# Patient Record
Sex: Male | Born: 1955 | Race: White | Hispanic: No | Marital: Married | Smoking: Current every day smoker
Health system: Southern US, Community
[De-identification: ages and names within clinical notes are randomized; demographics above are authoritative.]

## PROBLEM LIST (undated history)

## (undated) DIAGNOSIS — I2510 Atherosclerotic heart disease of native coronary artery without angina pectoris: Secondary | ICD-10-CM

## (undated) DIAGNOSIS — I719 Aortic aneurysm of unspecified site, without rupture: Secondary | ICD-10-CM

## (undated) DIAGNOSIS — I10 Essential (primary) hypertension: Secondary | ICD-10-CM

## (undated) DIAGNOSIS — Z87442 Personal history of urinary calculi: Secondary | ICD-10-CM

## (undated) DIAGNOSIS — C4491 Basal cell carcinoma of skin, unspecified: Secondary | ICD-10-CM

## (undated) DIAGNOSIS — J449 Chronic obstructive pulmonary disease, unspecified: Secondary | ICD-10-CM

## (undated) DIAGNOSIS — E785 Hyperlipidemia, unspecified: Secondary | ICD-10-CM

## (undated) DIAGNOSIS — Z872 Personal history of diseases of the skin and subcutaneous tissue: Secondary | ICD-10-CM

## (undated) HISTORY — DX: Hyperlipidemia, unspecified: E78.5

## (undated) HISTORY — DX: Essential (primary) hypertension: I10

## (undated) HISTORY — DX: Basal cell carcinoma of skin, unspecified: C44.91

## (undated) HISTORY — PX: SKIN CANCER EXCISION: SHX779

## (undated) HISTORY — DX: Personal history of diseases of the skin and subcutaneous tissue: Z87.2

## (undated) HISTORY — DX: Chronic obstructive pulmonary disease, unspecified: J44.9

---

## 2003-12-01 DIAGNOSIS — I252 Old myocardial infarction: Secondary | ICD-10-CM

## 2003-12-01 HISTORY — DX: Old myocardial infarction: I25.2

## 2003-12-23 ENCOUNTER — Other Ambulatory Visit: Payer: Self-pay

## 2004-11-22 ENCOUNTER — Ambulatory Visit: Payer: Self-pay | Admitting: Family Medicine

## 2006-05-14 ENCOUNTER — Ambulatory Visit: Payer: Self-pay | Admitting: Unknown Physician Specialty

## 2012-11-22 DIAGNOSIS — F17200 Nicotine dependence, unspecified, uncomplicated: Secondary | ICD-10-CM | POA: Insufficient documentation

## 2012-11-22 DIAGNOSIS — I2510 Atherosclerotic heart disease of native coronary artery without angina pectoris: Secondary | ICD-10-CM | POA: Insufficient documentation

## 2012-11-22 DIAGNOSIS — E7800 Pure hypercholesterolemia, unspecified: Secondary | ICD-10-CM | POA: Insufficient documentation

## 2013-07-11 ENCOUNTER — Ambulatory Visit: Payer: Self-pay | Admitting: Unknown Physician Specialty

## 2014-04-24 ENCOUNTER — Ambulatory Visit: Payer: Self-pay | Admitting: Family Medicine

## 2014-11-07 ENCOUNTER — Other Ambulatory Visit: Payer: Self-pay

## 2014-11-07 DIAGNOSIS — Z7189 Other specified counseling: Secondary | ICD-10-CM

## 2014-11-07 MED ORDER — NICOTINE 7 MG/24HR TD PT24: 7.0000 mg | MEDICATED_PATCH | Freq: Every day | TRANSDERMAL | Status: DC

## 2015-10-10 DIAGNOSIS — M9903 Segmental and somatic dysfunction of lumbar region: Secondary | ICD-10-CM | POA: Diagnosis not present

## 2015-10-10 DIAGNOSIS — M9901 Segmental and somatic dysfunction of cervical region: Secondary | ICD-10-CM | POA: Diagnosis not present

## 2015-10-10 DIAGNOSIS — M9902 Segmental and somatic dysfunction of thoracic region: Secondary | ICD-10-CM | POA: Diagnosis not present

## 2015-10-10 DIAGNOSIS — M545 Low back pain: Secondary | ICD-10-CM | POA: Diagnosis not present

## 2015-11-30 DIAGNOSIS — M9901 Segmental and somatic dysfunction of cervical region: Secondary | ICD-10-CM | POA: Diagnosis not present

## 2015-11-30 DIAGNOSIS — M9902 Segmental and somatic dysfunction of thoracic region: Secondary | ICD-10-CM | POA: Diagnosis not present

## 2015-11-30 DIAGNOSIS — M545 Low back pain: Secondary | ICD-10-CM | POA: Diagnosis not present

## 2015-11-30 DIAGNOSIS — M9903 Segmental and somatic dysfunction of lumbar region: Secondary | ICD-10-CM | POA: Diagnosis not present

## 2016-02-01 DIAGNOSIS — M9903 Segmental and somatic dysfunction of lumbar region: Secondary | ICD-10-CM | POA: Diagnosis not present

## 2016-02-01 DIAGNOSIS — M9902 Segmental and somatic dysfunction of thoracic region: Secondary | ICD-10-CM | POA: Diagnosis not present

## 2016-02-01 DIAGNOSIS — M545 Low back pain: Secondary | ICD-10-CM | POA: Diagnosis not present

## 2016-02-01 DIAGNOSIS — M9901 Segmental and somatic dysfunction of cervical region: Secondary | ICD-10-CM | POA: Diagnosis not present

## 2016-02-12 DIAGNOSIS — Z789 Other specified health status: Secondary | ICD-10-CM | POA: Diagnosis not present

## 2016-02-12 DIAGNOSIS — I2510 Atherosclerotic heart disease of native coronary artery without angina pectoris: Secondary | ICD-10-CM | POA: Diagnosis not present

## 2016-02-12 DIAGNOSIS — F17210 Nicotine dependence, cigarettes, uncomplicated: Secondary | ICD-10-CM | POA: Diagnosis not present

## 2016-02-21 ENCOUNTER — Encounter: Payer: Self-pay | Admitting: Family Medicine

## 2016-02-21 ENCOUNTER — Ambulatory Visit (INDEPENDENT_AMBULATORY_CARE_PROVIDER_SITE_OTHER): Payer: BLUE CROSS/BLUE SHIELD | Admitting: Family Medicine

## 2016-02-21 VITALS — BP 120/82 | HR 78 | Temp 97.8°F | Ht 70.0 in | Wt 205.0 lb

## 2016-02-21 DIAGNOSIS — J069 Acute upper respiratory infection, unspecified: Secondary | ICD-10-CM | POA: Diagnosis not present

## 2016-02-21 DIAGNOSIS — J40 Bronchitis, not specified as acute or chronic: Secondary | ICD-10-CM | POA: Diagnosis not present

## 2016-02-21 MED ORDER — GUAIFENESIN-CODEINE 100-10 MG/5ML PO SYRP: 5.0000 mL | ORAL_SOLUTION | Freq: Three times a day (TID) | ORAL | 0 refills | Status: DC | PRN

## 2016-02-21 MED ORDER — AZITHROMYCIN 250 MG PO TABS: ORAL_TABLET | ORAL | 0 refills | Status: DC

## 2016-02-21 NOTE — Progress Notes (Signed)
Name: Darren Page   MRN: [id number]    DOB: May 31, 1956   Date:02/21/2016       Progress Note  Subjective  Chief Complaint  Chief Complaint  Patient presents with  . Sinusitis    2 weeks/ chronic cough/ drainage- green prod    Sinusitis  This is a new problem. The current episode started in the past 7 days. The problem has been gradually worsening since onset. The pain is mild. Associated symptoms include congestion, coughing, ear pain and sinus pressure. Pertinent negatives include no chills, diaphoresis, headaches, hoarse voice, neck pain, shortness of breath, sneezing, sore throat or swollen glands. Past treatments include oral decongestants. The treatment provided no relief.  Cough  This is a new problem. The current episode started in the past 7 days. The problem has been gradually worsening. The cough is productive of purulent sputum. Associated symptoms include ear pain, nasal congestion and weight loss. Pertinent negatives include no chest pain, chills, fever, headaches, heartburn, hemoptysis, myalgias, postnasal drip, rash, sore throat, shortness of breath or wheezing. There is no history of environmental allergies.    No problem-specific Assessment & Plan notes found for this encounter.   Past Medical History:  Diagnosis Date  . Hyperlipidemia   . Hypertension     Past Surgical History:  Procedure Laterality Date  . SKIN CANCER EXCISION     mohs nose    History reviewed. No pertinent family history.  Social History   Social History  . Marital status: Married    Spouse name: N/A  . Number of children: N/A  . Years of education: N/A   Occupational History  . Not on file.   Social History Main Topics  . Smoking status: Not on file  . Smokeless tobacco: Not on file  . Alcohol use Not on file  . Drug use: Unknown  . Sexual activity: Not on file   Other Topics Concern  . Not on file   Social History Narrative  . No narrative on file     Allergies  Allergen Reactions  . Atorvastatin Rash    myalgias     Review of Systems  Constitutional: Positive for weight loss. Negative for chills, diaphoresis, fever and malaise/fatigue.  HENT: Positive for congestion, ear pain and sinus pressure. Negative for ear discharge, hoarse voice, postnasal drip, sneezing and sore throat.   Eyes: Negative for blurred vision.  Respiratory: Positive for cough. Negative for hemoptysis, sputum production, shortness of breath and wheezing.   Cardiovascular: Negative for chest pain, palpitations and leg swelling.  Gastrointestinal: Negative for abdominal pain, blood in stool, constipation, diarrhea, heartburn, melena and nausea.  Genitourinary: Negative for dysuria, frequency, hematuria and urgency.  Musculoskeletal: Negative for back pain, joint pain, myalgias and neck pain.  Skin: Negative for rash.  Neurological: Negative for dizziness, tingling, sensory change, focal weakness and headaches.  Endo/Heme/Allergies: Negative for environmental allergies and polydipsia. Does not bruise/bleed easily.  Psychiatric/Behavioral: Negative for depression and suicidal ideas. The patient is not nervous/anxious and does not have insomnia.      Objective  Vitals:   02/21/16 1638  BP: 120/82  Pulse: 78  Temp: 97.8 F (36.6 C)  Weight: 205 lb (93 kg)  Height: 5\' 10"  (1.778 m)    Physical Exam  Constitutional: He is oriented to person, place, and time and well-developed, well-nourished, and in no distress.  HENT:  Head: Normocephalic.  Right Ear: External ear normal.  Left Ear: External ear normal.  Nose: Nose normal.  Mouth/Throat: Oropharynx is clear and moist.  Eyes: Conjunctivae and EOM are normal. Pupils are equal, round, and reactive to light. Right eye exhibits no discharge. Left eye exhibits no discharge. No scleral icterus.  Neck: Normal range of motion. Neck supple. No JVD present. No tracheal deviation present. No thyromegaly present.   Cardiovascular: Normal rate, regular rhythm, normal heart sounds and intact distal pulses.  Exam reveals no gallop and no friction rub.   No murmur heard. Pulmonary/Chest: Breath sounds normal. No respiratory distress. He has no wheezes. He has no rales.  Abdominal: Soft. Bowel sounds are normal. He exhibits no mass. There is no hepatosplenomegaly. There is no tenderness. There is no rebound, no guarding and no CVA tenderness.  Musculoskeletal: Normal range of motion. He exhibits no edema or tenderness.  Lymphadenopathy:    He has no cervical adenopathy.  Neurological: He is alert and oriented to person, place, and time. He has normal sensation, normal strength, normal reflexes and intact cranial nerves. No cranial nerve deficit.  Skin: Skin is warm. No rash noted.  Psychiatric: Mood and affect normal.  Nursing note and vitals reviewed.     Assessment & Plan  Problem List Items Addressed This Visit    None    Visit Diagnoses    Bronchitis    -  Primary   Relevant Medications   guaiFENesin-codeine (ROBITUSSIN AC) 100-10 MG/5ML syrup   Upper respiratory infection       Relevant Medications   guaiFENesin-codeine (ROBITUSSIN AC) 100-10 MG/5ML syrup   azithromycin (ZITHROMAX) 250 MG tablet        Dr. Cortasia [facility]  02/21/16

## 2016-03-19 ENCOUNTER — Other Ambulatory Visit: Payer: Self-pay

## 2016-03-20 ENCOUNTER — Other Ambulatory Visit: Payer: Self-pay | Admitting: Family Medicine

## 2016-03-20 MED ORDER — LEVOFLOXACIN 500 MG PO TABS: 500.0000 mg | ORAL_TABLET | Freq: Every day | ORAL | 0 refills | Status: DC

## 2016-04-11 ENCOUNTER — Encounter: Payer: Self-pay | Admitting: Family Medicine

## 2016-04-11 ENCOUNTER — Ambulatory Visit
Admission: RE | Admit: 2016-04-11 | Discharge: 2016-04-11 | Disposition: A | Discharge status: Home / Self Care | Payer: BLUE CROSS/BLUE SHIELD | Source: Ambulatory Visit | Attending: Family Medicine | Admitting: Family Medicine

## 2016-04-11 ENCOUNTER — Ambulatory Visit (INDEPENDENT_AMBULATORY_CARE_PROVIDER_SITE_OTHER): Payer: BLUE CROSS/BLUE SHIELD | Admitting: Family Medicine

## 2016-04-11 VITALS — BP 120/78 | HR 68 | Ht 70.0 in | Wt 206.0 lb

## 2016-04-11 DIAGNOSIS — J410 Simple chronic bronchitis: Secondary | ICD-10-CM

## 2016-04-11 DIAGNOSIS — J40 Bronchitis, not specified as acute or chronic: Secondary | ICD-10-CM | POA: Diagnosis not present

## 2016-04-11 NOTE — Progress Notes (Signed)
Name: Darren Page   MRN: [id number]    DOB: [birth year]   Date:04/11/2016       Progress Note  Subjective  Chief Complaint  Chief Complaint  Patient presents with  . Follow-up    needs a chest xray due to chronic sinusitis/ cough    Cough  This is a recurrent problem. The current episode started 1 to 4 weeks ago. The problem has been gradually improving. Episode frequency: resolved. The cough is non-productive. Associated symptoms include weight loss. Pertinent negatives include no chest pain, chills, ear congestion, ear pain, fever, headaches, heartburn, hemoptysis, myalgias, nasal congestion, postnasal drip, rash, rhinorrhea, sore throat, shortness of breath, sweats or wheezing. Associated symptoms comments: "trying to lose a little". Nothing aggravates the symptoms. He has tried nothing (antibiotics) for the symptoms. The treatment provided mild relief. There is no history of asthma, bronchiectasis, bronchitis, COPD, emphysema, environmental allergies or pneumonia.    No problem-specific Assessment & Plan notes found for this encounter.   Past Medical History:  Diagnosis Date  . COPD (chronic obstructive pulmonary disease) ([place])   . Hyperlipidemia   . Hypertension     Past Surgical History:  Procedure Laterality Date  . SKIN CANCER EXCISION     mohs nose    History reviewed. No pertinent family history.  Social History   Social History  . Marital status: Married    Spouse name: N/A  . Number of children: N/A  . Years of education: N/A   Occupational History  . Not on file.   Social History Main Topics  . Smoking status: Former Research scientist (life sciences)  . Smokeless tobacco: Never Used  . Alcohol use Not on file  . Drug use: Unknown  . Sexual activity: Not Currently   Other Topics Concern  . Not on file   Social History Narrative  . No narrative on file    Allergies  Allergen Reactions  . Atorvastatin Rash    myalgias     Review of Systems  Constitutional:  Positive for weight loss. Negative for chills, fever and malaise/fatigue.  HENT: Negative for ear discharge, ear pain, postnasal drip, rhinorrhea and sore throat.   Eyes: Negative for blurred vision.  Respiratory: Positive for cough. Negative for hemoptysis, sputum production, shortness of breath and wheezing.   Cardiovascular: Negative for chest pain, palpitations and leg swelling.  Gastrointestinal: Negative for abdominal pain, blood in stool, constipation, diarrhea, heartburn, melena and nausea.  Genitourinary: Negative for dysuria, frequency, hematuria and urgency.  Musculoskeletal: Negative for back pain, joint pain, myalgias and neck pain.  Skin: Negative for rash.  Neurological: Negative for dizziness, tingling, sensory change, focal weakness and headaches.  Endo/Heme/Allergies: Negative for environmental allergies and polydipsia. Does not bruise/bleed easily.  Psychiatric/Behavioral: Negative for depression and suicidal ideas. The patient is not nervous/anxious and does not have insomnia.      Objective  Vitals:   04/11/16 0859  BP: 120/78  Pulse: 68  SpO2: 98%  Weight: 206 lb (93.4 kg)  Height: 5\' 10"  (1.778 m)    Physical Exam  Constitutional: He is oriented to person, place, and time and well-developed, well-nourished, and in no distress.  HENT:  Head: Normocephalic.  Right Ear: External ear normal.  Left Ear: External ear normal.  Nose: Nose normal.  Mouth/Throat: Oropharynx is clear and moist.  Eyes: Conjunctivae and EOM are normal. Pupils are equal, round, and reactive to light. Right eye exhibits no discharge. Left eye exhibits no discharge. No scleral icterus.  Neck: Normal  range of motion. Neck supple. No JVD present. No tracheal deviation present. No thyromegaly present.  Cardiovascular: Normal rate, regular rhythm, normal heart sounds and intact distal pulses.  Exam reveals no gallop and no friction rub.   No murmur heard. Pulmonary/Chest: Breath sounds  normal. No respiratory distress. He has no wheezes. He has no rales.  Abdominal: Soft. Bowel sounds are normal. He exhibits no mass. There is no hepatosplenomegaly. There is no tenderness. There is no rebound, no guarding and no CVA tenderness.  Musculoskeletal: Normal range of motion. He exhibits no edema or tenderness.  Lymphadenopathy:    He has no cervical adenopathy.  Neurological: He is alert and oriented to person, place, and time. He has normal sensation, normal strength, normal reflexes and intact cranial nerves. No cranial nerve deficit.  Skin: Skin is warm. No rash noted.  Psychiatric: Mood and affect normal.  Nursing note and vitals reviewed.     Assessment & Plan  Problem List Items Addressed This Visit    None    Visit Diagnoses    Simple chronic bronchitis ([place])    -  Primary   Relevant Orders   DG Chest 2 View (Completed)        Dr. Otilio Miu [facility]  04/11/16

## 2016-05-20 DIAGNOSIS — D485 Neoplasm of uncertain behavior of skin: Secondary | ICD-10-CM | POA: Diagnosis not present

## 2016-05-20 DIAGNOSIS — C4441 Basal cell carcinoma of skin of scalp and neck: Secondary | ICD-10-CM | POA: Diagnosis not present

## 2016-05-20 DIAGNOSIS — L821 Other seborrheic keratosis: Secondary | ICD-10-CM | POA: Diagnosis not present

## 2016-05-20 DIAGNOSIS — Z08 Encounter for follow-up examination after completed treatment for malignant neoplasm: Secondary | ICD-10-CM | POA: Diagnosis not present

## 2016-05-20 DIAGNOSIS — Z1283 Encounter for screening for malignant neoplasm of skin: Secondary | ICD-10-CM | POA: Diagnosis not present

## 2016-05-20 DIAGNOSIS — L570 Actinic keratosis: Secondary | ICD-10-CM | POA: Diagnosis not present

## 2016-05-20 DIAGNOSIS — Z85828 Personal history of other malignant neoplasm of skin: Secondary | ICD-10-CM | POA: Diagnosis not present

## 2016-06-02 DIAGNOSIS — C4491 Basal cell carcinoma of skin, unspecified: Secondary | ICD-10-CM

## 2016-06-02 HISTORY — DX: Basal cell carcinoma of skin, unspecified: C44.91

## 2016-06-30 DIAGNOSIS — C4441 Basal cell carcinoma of skin of scalp and neck: Secondary | ICD-10-CM | POA: Diagnosis not present

## 2016-06-30 DIAGNOSIS — L821 Other seborrheic keratosis: Secondary | ICD-10-CM | POA: Diagnosis not present

## 2016-07-15 DIAGNOSIS — I2510 Atherosclerotic heart disease of native coronary artery without angina pectoris: Secondary | ICD-10-CM | POA: Diagnosis not present

## 2016-07-15 DIAGNOSIS — F17210 Nicotine dependence, cigarettes, uncomplicated: Secondary | ICD-10-CM | POA: Diagnosis not present

## 2016-07-15 DIAGNOSIS — Z789 Other specified health status: Secondary | ICD-10-CM | POA: Diagnosis not present

## 2016-07-15 DIAGNOSIS — E785 Hyperlipidemia, unspecified: Secondary | ICD-10-CM | POA: Diagnosis not present

## 2016-09-11 DIAGNOSIS — H6980 Other specified disorders of Eustachian tube, unspecified ear: Secondary | ICD-10-CM | POA: Diagnosis not present

## 2016-09-11 DIAGNOSIS — H903 Sensorineural hearing loss, bilateral: Secondary | ICD-10-CM | POA: Diagnosis not present

## 2016-10-14 DIAGNOSIS — H903 Sensorineural hearing loss, bilateral: Secondary | ICD-10-CM | POA: Diagnosis not present

## 2017-01-15 ENCOUNTER — Encounter: Payer: Self-pay | Admitting: Family Medicine

## 2017-01-15 ENCOUNTER — Ambulatory Visit (INDEPENDENT_AMBULATORY_CARE_PROVIDER_SITE_OTHER): Payer: BLUE CROSS/BLUE SHIELD | Admitting: Family Medicine

## 2017-01-15 VITALS — BP 120/70 | HR 72 | Ht 70.0 in | Wt 204.0 lb

## 2017-01-15 DIAGNOSIS — J0100 Acute maxillary sinusitis, unspecified: Secondary | ICD-10-CM | POA: Diagnosis not present

## 2017-01-15 DIAGNOSIS — J40 Bronchitis, not specified as acute or chronic: Secondary | ICD-10-CM | POA: Diagnosis not present

## 2017-01-15 MED ORDER — AMOXICILLIN-POT CLAVULANATE 875-125 MG PO TABS: 1.0000 | ORAL_TABLET | Freq: Two times a day (BID) | ORAL | 0 refills | Status: DC

## 2017-01-15 MED ORDER — GUAIFENESIN-CODEINE 100-10 MG/5ML PO SYRP: 5.0000 mL | ORAL_SOLUTION | Freq: Three times a day (TID) | ORAL | 0 refills | Status: DC | PRN

## 2017-01-15 NOTE — Progress Notes (Signed)
Name: Darren Page   MRN: [id number]    DOB: Jul 29, 1955   Date:01/15/2017       Progress Note  Subjective  Chief Complaint  Chief Complaint  Patient presents with  . Sinusitis    started about 1 month ago- drainage and cough gets worse at night. "Fly alot"    Sinusitis  This is a new problem. The current episode started 1 to 4 weeks ago. The problem is unchanged. There has been no fever. The fever has been present for 3 to 4 days. The pain is moderate. Pertinent negatives include no chills, congestion, coughing, diaphoresis, ear pain, headaches, hoarse voice, neck pain, shortness of breath, sinus pressure, sneezing, sore throat or swollen glands. Past treatments include nothing. The treatment provided moderate relief.  Cough  This is a chronic problem. The problem has been waxing and waning. The cough is productive of purulent sputum. Associated symptoms include ear congestion, nasal congestion and rhinorrhea. Pertinent negatives include no chest pain, chills, ear pain, fever, headaches, heartburn, hemoptysis, myalgias, postnasal drip, rash, sore throat, shortness of breath, sweats, weight loss or wheezing. The symptoms are aggravated by pollens. The treatment provided mild relief. There is no history of asthma, bronchiectasis, bronchitis, COPD, emphysema, environmental allergies or pneumonia.    No problem-specific Assessment & Plan notes found for this encounter.   Past Medical History:  Diagnosis Date  . COPD (chronic obstructive pulmonary disease) ([place])   . Hyperlipidemia   . Hypertension     Past Surgical History:  Procedure Laterality Date  . SKIN CANCER EXCISION     mohs nose    No family history on file.  Social History   Social History  . Marital status: Married    Spouse name: N/A  . Number of children: N/A  . Years of education: N/A   Occupational History  . Not on file.   Social History Main Topics  . Smoking status: Former Research scientist (life sciences)  . Smokeless  tobacco: Never Used  . Alcohol use Not on file  . Drug use: Unknown  . Sexual activity: Not Currently   Other Topics Concern  . Not on file   Social History Narrative  . No narrative on file    Allergies  Allergen Reactions  . Atorvastatin Rash    myalgias    Outpatient Medications Prior to Visit  Medication Sig Dispense Refill  . acetaminophen (TYLENOL) 500 MG tablet Take 1,000 mg by mouth.    Marland Kitchen aspirin 81 MG chewable tablet Chew 81 mg by mouth.    . metoprolol succinate (TOPROL-XL) 25 MG 24 hr tablet Take 25 mg by mouth.    . nicotine (NICODERM CQ) 7 mg/24hr patch Place 1 patch (7 mg total) onto the skin daily. 28 patch 0  . Omega-3 1000 MG CAPS Take 1 capsule by mouth daily.    . nitroGLYCERIN (NITROSTAT) 0.4 MG SL tablet Place 0.4 mg under the tongue.     No facility-administered medications prior to visit.     Review of Systems  Constitutional: Negative for chills, diaphoresis, fever, malaise/fatigue and weight loss.  HENT: Positive for rhinorrhea. Negative for congestion, ear discharge, ear pain, hoarse voice, postnasal drip, sinus pressure, sneezing and sore throat.   Eyes: Negative for blurred vision.  Respiratory: Negative for cough, hemoptysis, sputum production, shortness of breath and wheezing.   Cardiovascular: Negative for chest pain, palpitations and leg swelling.  Gastrointestinal: Negative for abdominal pain, blood in stool, constipation, diarrhea, heartburn, melena and nausea.  Genitourinary:  Negative for dysuria, frequency, hematuria and urgency.  Musculoskeletal: Negative for back pain, joint pain, myalgias and neck pain.  Skin: Negative for rash.  Neurological: Negative for dizziness, tingling, sensory change, focal weakness and headaches.  Endo/Heme/Allergies: Negative for environmental allergies and polydipsia. Does not bruise/bleed easily.  Psychiatric/Behavioral: Negative for depression and suicidal ideas. The patient is not nervous/anxious and does  not have insomnia.      Objective  Vitals:   01/15/17 1547  BP: 120/70  Pulse: 72  Weight: 204 lb (92.5 kg)  Height: 5\' 10"  (1.778 m)    Physical Exam  Constitutional: He is oriented to person, place, and time and well-developed, well-nourished, and in no distress.  HENT:  Head: Normocephalic.  Right Ear: External ear normal.  Left Ear: External ear normal.  Nose: Nose normal.  Mouth/Throat: Oropharynx is clear and moist.  Eyes: Pupils are equal, round, and reactive to light. Conjunctivae and EOM are normal. Right eye exhibits no discharge. Left eye exhibits no discharge. No scleral icterus.  Neck: Normal range of motion. Neck supple. No JVD present. No tracheal deviation present. No thyromegaly present.  Cardiovascular: Normal rate, regular rhythm, normal heart sounds and intact distal pulses.  Exam reveals no gallop and no friction rub.   No murmur heard. Pulmonary/Chest: Breath sounds normal. No respiratory distress. He has no wheezes. He has no rales.  Abdominal: Soft. Bowel sounds are normal. He exhibits no mass. There is no hepatosplenomegaly. There is no tenderness. There is no rebound, no guarding and no CVA tenderness.  Musculoskeletal: Normal range of motion. He exhibits no edema or tenderness.  Lymphadenopathy:    He has no cervical adenopathy.  Neurological: He is alert and oriented to person, place, and time. He has normal sensation, normal strength and intact cranial nerves. No cranial nerve deficit.  Skin: Skin is warm. No rash noted.  Psychiatric: Mood and affect normal.  Nursing note and vitals reviewed.     Assessment & Plan  Problem List Items Addressed This Visit    None    Visit Diagnoses    Bronchitis    -  Primary   Relevant Medications   amoxicillin-clavulanate (AUGMENTIN) 875-125 MG tablet   guaiFENesin-codeine (ROBITUSSIN AC) 100-10 MG/5ML syrup   Acute maxillary sinusitis, recurrence not specified       Relevant Medications    amoxicillin-clavulanate (AUGMENTIN) 875-125 MG tablet   guaiFENesin-codeine (ROBITUSSIN AC) 100-10 MG/5ML syrup      Meds ordered this encounter  Medications  . amoxicillin-clavulanate (AUGMENTIN) 875-125 MG tablet    Sig: Take 1 tablet by mouth 2 (two) times daily.    Dispense:  20 tablet    Refill:  0  . guaiFENesin-codeine (ROBITUSSIN AC) 100-10 MG/5ML syrup    Sig: Take 5 mLs by mouth 3 (three) times daily as needed for cough.    Dispense:  150 mL    Refill:  0      Dr. [facility]  01/15/17

## 2017-01-16 ENCOUNTER — Ambulatory Visit: Payer: BLUE CROSS/BLUE SHIELD | Admitting: Family Medicine

## 2017-01-27 ENCOUNTER — Other Ambulatory Visit: Payer: Self-pay

## 2017-01-27 MED ORDER — LEVOFLOXACIN 500 MG PO TABS: 500.0000 mg | ORAL_TABLET | Freq: Every day | ORAL | 0 refills | Status: DC

## 2017-01-27 NOTE — Telephone Encounter (Signed)
Patient called and stated he finished his antibiotics and stated still not feeling well. Spoke with Dr. Ronnald Ramp and she verbally stated it was okay to send in Levaquin 500 mg to [facility].

## 2017-01-28 ENCOUNTER — Other Ambulatory Visit: Payer: Self-pay

## 2017-01-28 DIAGNOSIS — R059 Cough, unspecified: Secondary | ICD-10-CM

## 2017-01-28 DIAGNOSIS — R05 Cough: Secondary | ICD-10-CM

## 2017-01-28 MED ORDER — DOXYCYCLINE HYCLATE 100 MG PO TABS: 100.0000 mg | ORAL_TABLET | Freq: Two times a day (BID) | ORAL | 0 refills | Status: DC

## 2017-02-03 IMAGING — CR DG CHEST 2V
2 series · 3 of 3 positions shown · non-contrast
Comparison: 04/24/2014 .

CLINICAL DATA: Bronchitis.

EXAM:
CHEST  2 VIEW

[Series 1: chest pa · 0.14mm/px · 2 of 2 slices shown]
[im 1/2]
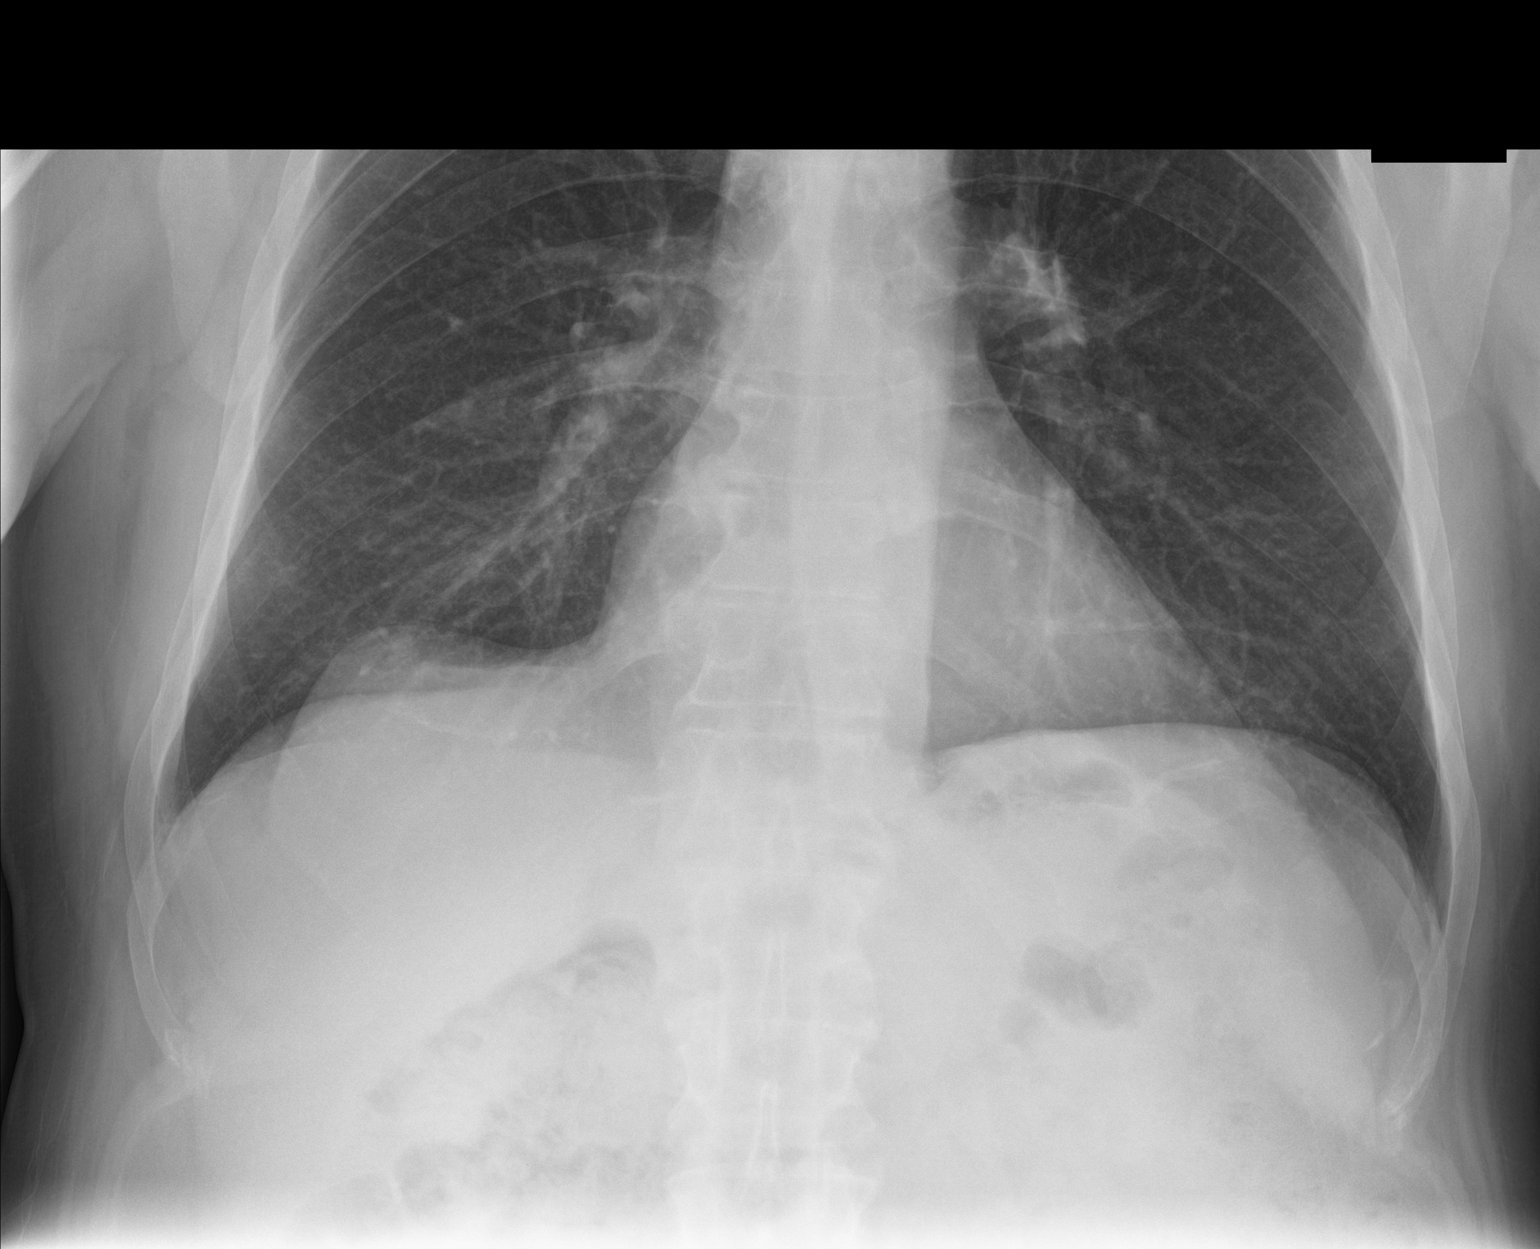
[im 2/2]
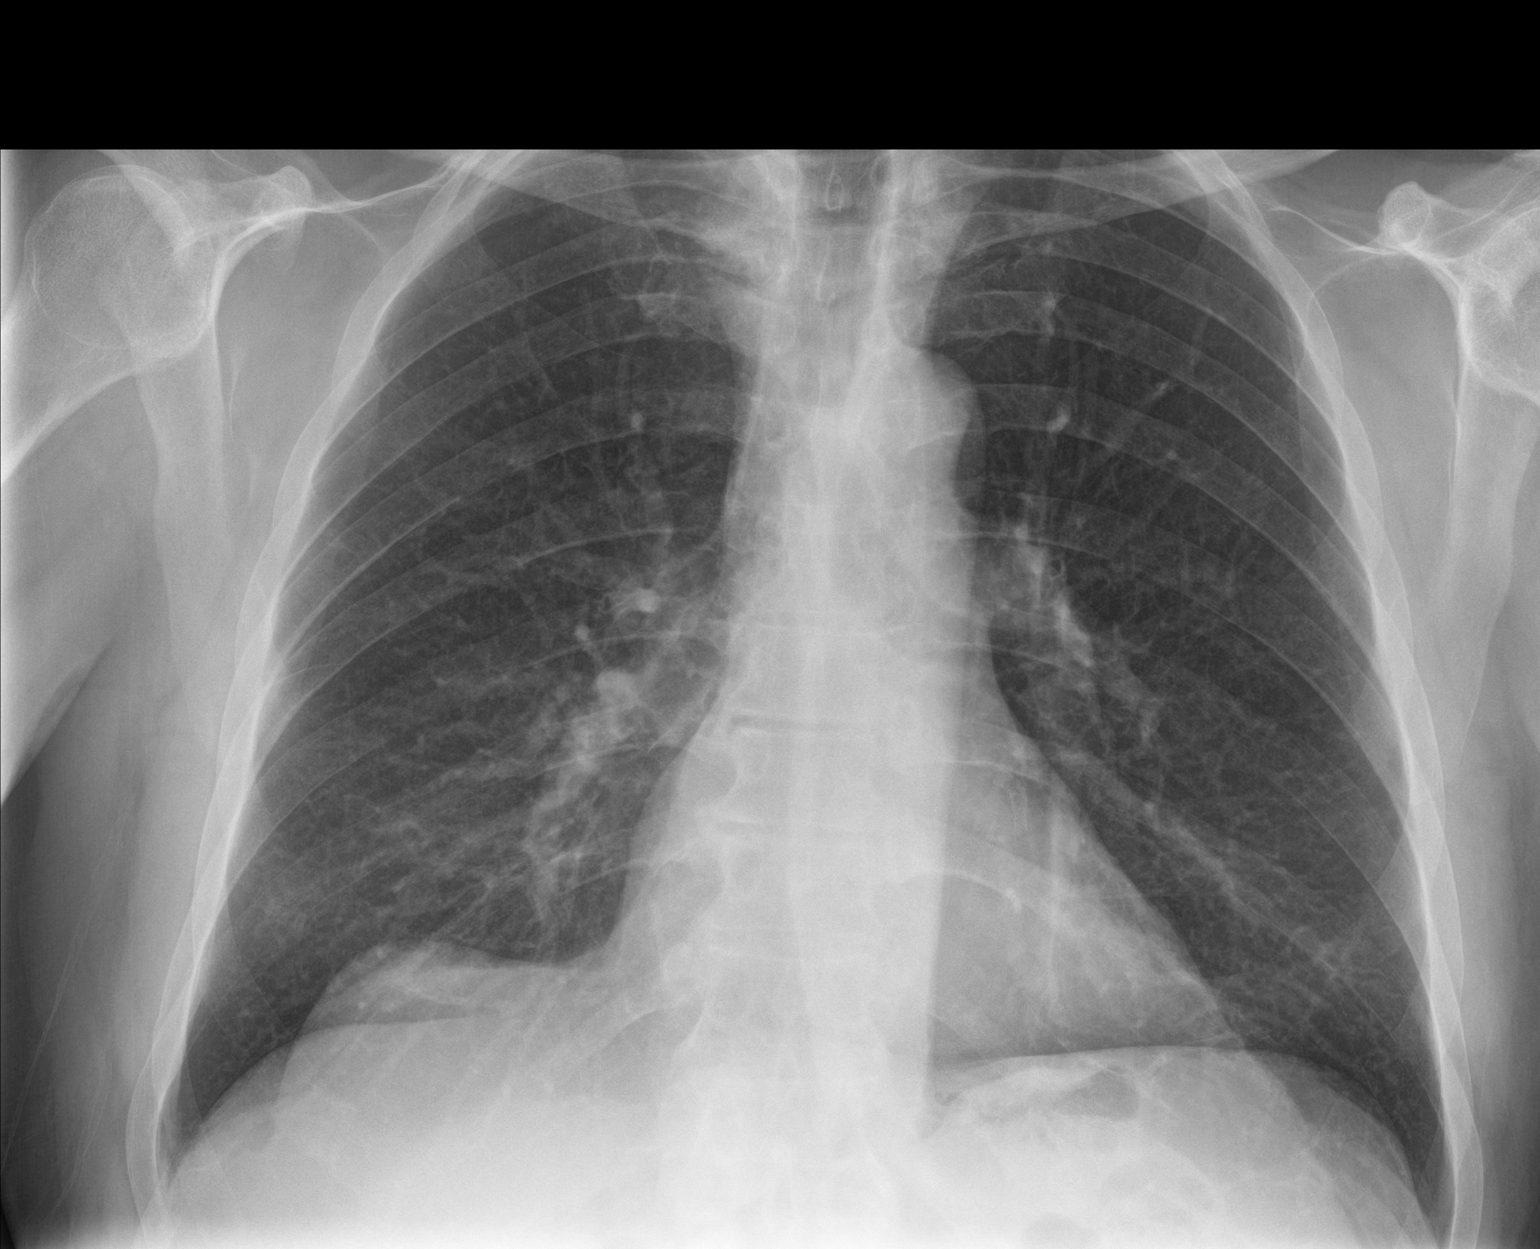

[chest lat]
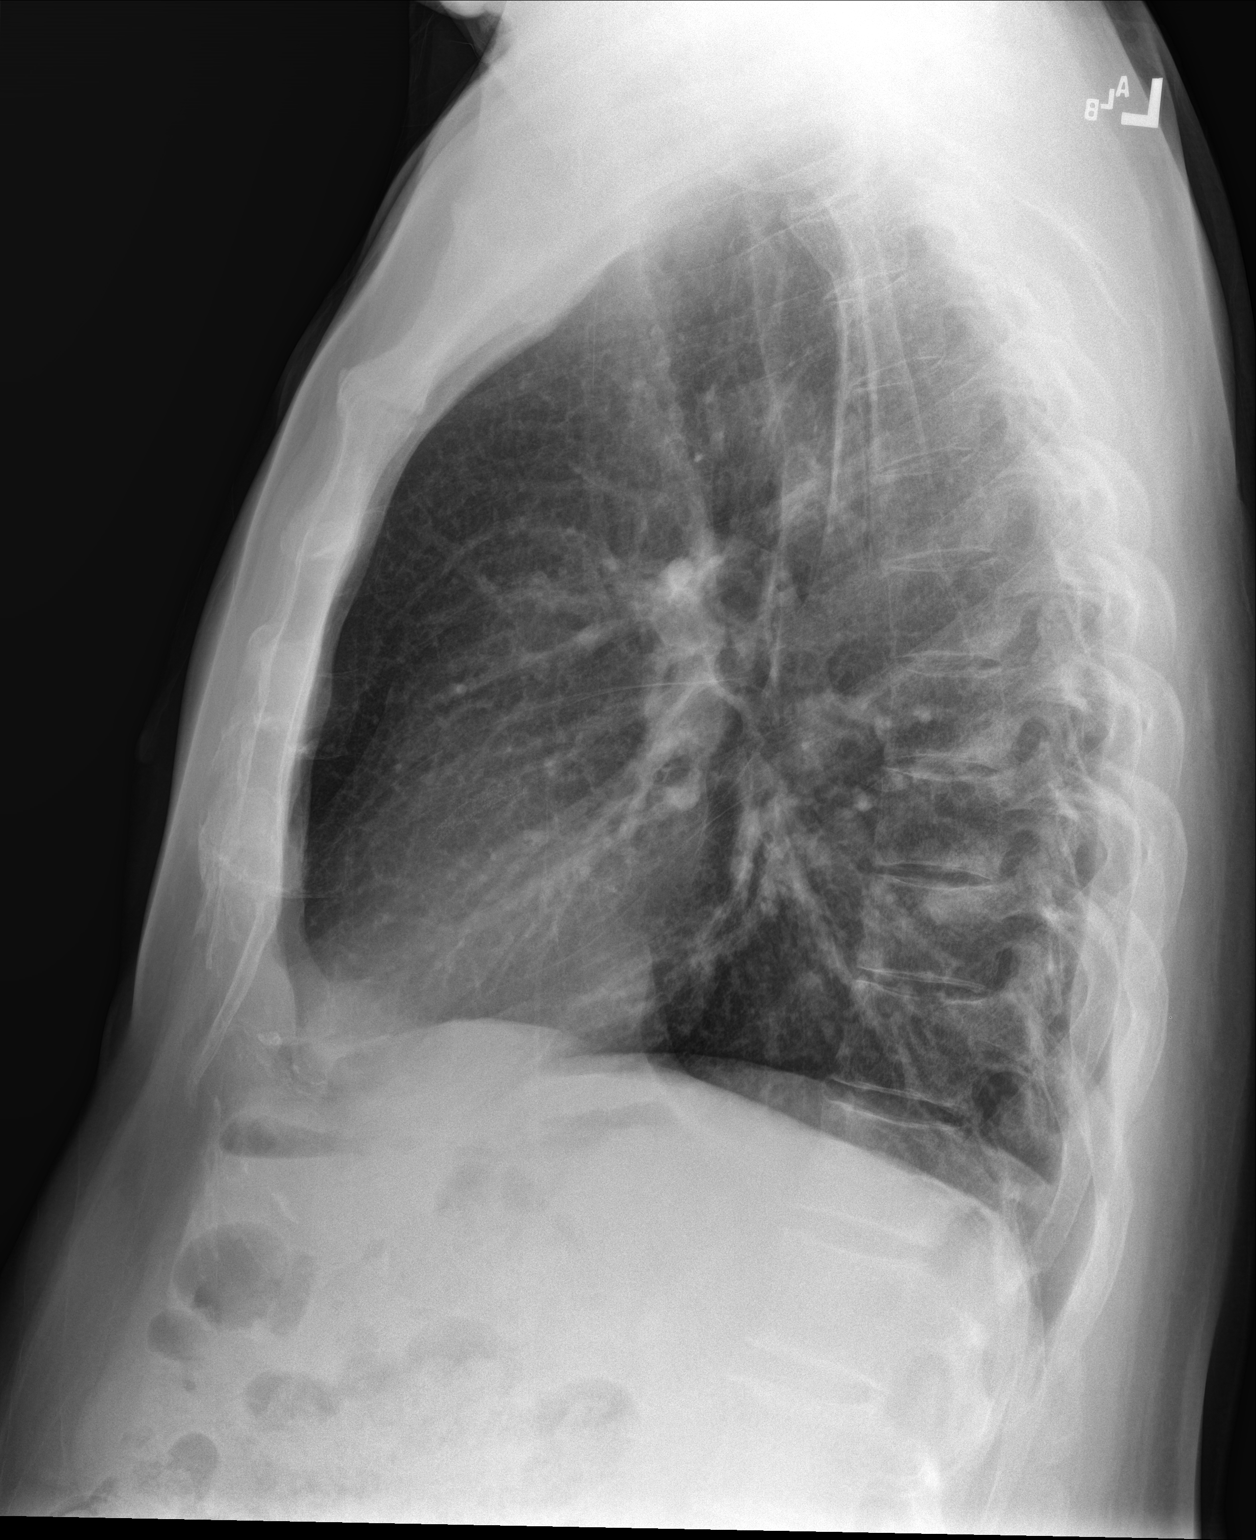

[3 of 3 positions shown; findings below may reference images not displayed]

FINDINGS: Mediastinum hilar structures normal. No focal infiltrate. No pleural
effusion or pneumothorax. Heart size stable. No acute bony
abnormality. Degenerative changes thoracic spine .
IMPRESSION: No acute cardiopulmonary disease.

## 2017-02-27 DIAGNOSIS — Z6830 Body mass index (BMI) 30.0-30.9, adult: Secondary | ICD-10-CM | POA: Diagnosis not present

## 2017-02-27 DIAGNOSIS — I2510 Atherosclerotic heart disease of native coronary artery without angina pectoris: Secondary | ICD-10-CM | POA: Diagnosis not present

## 2017-03-09 ENCOUNTER — Other Ambulatory Visit: Payer: Self-pay | Admitting: Family Medicine

## 2017-05-21 ENCOUNTER — Encounter: Payer: Self-pay | Admitting: Family Medicine

## 2017-05-21 ENCOUNTER — Ambulatory Visit: Payer: BLUE CROSS/BLUE SHIELD | Admitting: Family Medicine

## 2017-05-21 VITALS — BP 120/80 | HR 60 | Ht 70.0 in | Wt 208.0 lb

## 2017-05-21 DIAGNOSIS — F17210 Nicotine dependence, cigarettes, uncomplicated: Secondary | ICD-10-CM

## 2017-05-21 MED ORDER — NICOTINE 21 MG/24HR TD PT24: 21.0000 mg | MEDICATED_PATCH | Freq: Every day | TRANSDERMAL | 0 refills | Status: DC

## 2017-05-21 MED ORDER — NICOTINE POLACRILEX 4 MG MT GUM: 4.0000 mg | CHEWING_GUM | OROMUCOSAL | 0 refills | Status: DC | PRN

## 2017-05-21 NOTE — Progress Notes (Signed)
Name: Darren Page   MRN: [id number]    DOB: [birth year]   Date:05/21/2017       Progress Note  Subjective  Chief Complaint  Chief Complaint  Patient presents with  . Nicotine Dependence    wants a Rx for patches    Nicotine Dependence  Presents for initial visit. Symptoms are negative for cravings, decreased concentration, fatigue, headache, insomnia, irritability and sore throat. Preferred tobacco types include cigarettes. Preferred cigarette types include filtered. Preferred strength is regular. His urge triggers include company of smokers. Risk factors do not include contact with substance, decrease in perceived risk, disruptive behavior, drinking alcohol, drinking coffee, driving, meal time, perceived media message about smoking or stress.He smokes 1 pack of cigarettes per day. Past treatments include nicotine patch. The treatment provided significant relief. Compliance with prior treatments has been good.    No problem-specific Assessment & Plan notes found for this encounter.   Past Medical History:  Diagnosis Date  . COPD (chronic obstructive pulmonary disease) ([place])   . Hyperlipidemia   . Hypertension     Past Surgical History:  Procedure Laterality Date  . SKIN CANCER EXCISION     mohs nose    No family history on file.  Social History   Socioeconomic History  . Marital status: Married    Spouse name: Not on file  . Number of children: Not on file  . Years of education: Not on file  . Highest education level: Not on file  Social Needs  . Financial resource strain: Not on file  . Food insecurity - worry: Not on file  . Food insecurity - inability: Not on file  . Transportation needs - medical: Not on file  . Transportation needs - non-medical: Not on file  Occupational History  . Not on file  Tobacco Use  . Smoking status: Current Some Day Smoker    Types: Cigarettes  . Smokeless tobacco: Never Used  Substance and Sexual Activity  . Alcohol  use: Not on file  . Drug use: Not on file  . Sexual activity: Not Currently  Other Topics Concern  . Not on file  Social History Narrative  . Not on file    Allergies  Allergen Reactions  . Atorvastatin Rash    myalgias    Outpatient Medications Prior to Visit  Medication Sig Dispense Refill  . acetaminophen (TYLENOL) 500 MG tablet Take 1,000 mg by mouth.    Marland Kitchen aspirin 81 MG chewable tablet Chew 81 mg by mouth.    . metoprolol succinate (TOPROL-XL) 25 MG 24 hr tablet Take 25 mg by mouth.    . nitroGLYCERIN (NITROSTAT) 0.4 MG SL tablet Place 0.4 mg under the tongue.    . Omega-3 1000 MG CAPS Take 1 capsule by mouth daily.    Marland Kitchen amoxicillin-clavulanate (AUGMENTIN) 875-125 MG tablet Take 1 tablet by mouth 2 (two) times daily. 20 tablet 0  . doxycycline (VIBRA-TABS) 100 MG tablet Take 1 tablet (100 mg total) by mouth 2 (two) times daily. 20 tablet 0  . guaiFENesin-codeine (ROBITUSSIN AC) 100-10 MG/5ML syrup Take 5 mLs by mouth 3 (three) times daily as needed for cough. 150 mL 0  . levofloxacin (LEVAQUIN) 500 MG tablet Take 1 tablet (500 mg total) by mouth daily. 7 tablet 0  . nicotine (NICODERM CQ) 7 mg/24hr patch Place 1 patch (7 mg total) onto the skin daily. 28 patch 0   No facility-administered medications prior to visit.     Review of Systems  Constitutional: Negative for chills, fatigue, fever, irritability, malaise/fatigue and weight loss.  HENT: Negative for ear discharge, ear pain and sore throat.   Eyes: Negative for blurred vision.  Respiratory: Negative for cough, sputum production, shortness of breath and wheezing.   Cardiovascular: Negative for chest pain, palpitations and leg swelling.  Gastrointestinal: Negative for abdominal pain, blood in stool, constipation, diarrhea, heartburn, melena and nausea.  Genitourinary: Negative for dysuria, frequency, hematuria and urgency.  Musculoskeletal: Negative for back pain, joint pain, myalgias and neck pain.  Skin: Negative for  rash.  Neurological: Negative for dizziness, tingling, sensory change, focal weakness and headaches.  Endo/Heme/Allergies: Negative for environmental allergies and polydipsia. Does not bruise/bleed easily.  Psychiatric/Behavioral: Negative for decreased concentration, depression and suicidal ideas. The patient is not nervous/anxious and does not have insomnia.      Objective  Vitals:   05/21/17 1059  BP: 120/80  Pulse: 60  Weight: 208 lb (94.3 kg)  Height: 5\' 10"  (1.778 m)    Physical Exam  Constitutional: He is oriented to person, place, and time and well-developed, well-nourished, and in no distress.  HENT:  Head: Normocephalic.  Right Ear: External ear normal.  Left Ear: External ear normal.  Nose: Nose normal.  Mouth/Throat: Oropharynx is clear and moist.  Eyes: Conjunctivae and EOM are normal. Pupils are equal, round, and reactive to light. Right eye exhibits no discharge. Left eye exhibits no discharge. No scleral icterus.  Neck: Normal range of motion. Neck supple. No JVD present. No tracheal deviation present. No thyromegaly present.  Cardiovascular: Normal rate, regular rhythm, normal heart sounds and intact distal pulses. Exam reveals no gallop and no friction rub.  No murmur heard. Pulmonary/Chest: Breath sounds normal. No respiratory distress. He has no wheezes. He has no rales.  Abdominal: Soft. Bowel sounds are normal. He exhibits no mass. There is no hepatosplenomegaly. There is no tenderness. There is no rebound, no guarding and no CVA tenderness.  Musculoskeletal: Normal range of motion. He exhibits no edema or tenderness.  Lymphadenopathy:    He has no cervical adenopathy.  Neurological: He is alert and oriented to person, place, and time. He has normal sensation, normal strength, normal reflexes and intact cranial nerves. No cranial nerve deficit.  Skin: Skin is warm. No rash noted.  Psychiatric: Mood and affect normal.  Nursing note and vitals  reviewed.     Assessment & Plan  Problem List Items Addressed This Visit    None    Visit Diagnoses    Cigarette nicotine dependence without complication    -  Primary   Relevant Medications   nicotine (NICODERM CQ - DOSED IN MG/24 HOURS) 21 mg/24hr patch   nicotine polacrilex (NICORETTE) 4 MG gum      Meds ordered this encounter  Medications  . nicotine (NICODERM CQ - DOSED IN MG/24 HOURS) 21 mg/24hr patch    Sig: Place 1 patch (21 mg total) onto the skin daily.    Dispense:  28 patch    Refill:  0  . nicotine polacrilex (NICORETTE) 4 MG gum    Sig: Take 1 each (4 mg total) by mouth as needed for smoking cessation.    Dispense:  100 tablet    Refill:  0   I spent 35 minutes with this patient, More than 50% of that time was spent in face to face education, counseling and care coordination.   Dr. [facility]  05/21/17

## 2017-05-21 NOTE — Patient Instructions (Signed)
Steps to Quit Smoking Smoking tobacco can be harmful to your health and can affect almost every organ in your body. Smoking puts you, and those around you, at risk for developing many serious chronic diseases. Quitting smoking is difficult, but it is one of the best things that you can do for your health. It is never too late to quit. What are the benefits of quitting smoking? When you quit smoking, you lower your risk of developing serious diseases and conditions, such as:  Lung cancer or lung disease, such as COPD.  Heart disease.  Stroke.  Heart attack.  Infertility.  Osteoporosis and bone fractures.  Additionally, symptoms such as coughing, wheezing, and shortness of breath may get better when you quit. You may also find that you get sick less often because your body is stronger at fighting off colds and infections. If you are pregnant, quitting smoking can help to reduce your chances of having a baby of low birth weight. How do I get ready to quit? When you decide to quit smoking, create a plan to make sure that you are successful. Before you quit:  Pick a date to quit. Set a date within the next two weeks to give you time to prepare.  Write down the reasons why you are quitting. Keep this list in places where you will see it often, such as on your bathroom mirror or in your car or wallet.  Identify the people, places, things, and activities that make you want to smoke (triggers) and avoid them. Make sure to take these actions: ? Throw away all cigarettes at home, at work, and in your car. ? Throw away smoking accessories, such as ashtrays and lighters. ? Clean your car and make sure to empty the ashtray. ? Clean your home, including curtains and carpets.  Tell your family, friends, and coworkers that you are quitting. Support from your loved ones can make quitting easier.  Talk with your health care provider about your options for quitting smoking.  Find out what treatment  options are covered by your health insurance.  What strategies can I use to quit smoking? Talk with your healthcare provider about different strategies to quit smoking. Some strategies include:  Quitting smoking altogether instead of gradually lessening how much you smoke over a period of time. Research shows that quitting "cold turkey" is more successful than gradually quitting.  Attending in-person counseling to help you build problem-solving skills. You are more likely to have success in quitting if you attend several counseling sessions. Even short sessions of 10 minutes can be effective.  Finding resources and support systems that can help you to quit smoking and remain smoke-free after you quit. These resources are most helpful when you use them often. They can include: ? Online chats with a counselor. ? Telephone quitlines. ? Printed self-help materials. ? Support groups or group counseling. ? Text messaging programs. ? Mobile phone applications.  Taking medicines to help you quit smoking. (If you are pregnant or breastfeeding, talk with your health care provider first.) Some medicines contain nicotine and some do not. Both types of medicines help with cravings, but the medicines that include nicotine help to relieve withdrawal symptoms. Your health care provider may recommend: ? Nicotine patches, gum, or lozenges. ? Nicotine inhalers or sprays. ? Non-nicotine medicine that is taken by mouth.  Talk with your health care provider about combining strategies, such as taking medicines while you are also receiving in-person counseling. Using these two strategies together   makes you more likely to succeed in quitting than if you used either strategy on its own. If you are pregnant or breastfeeding, talk with your health care provider about finding counseling or other support strategies to quit smoking. Do not take medicine to help you quit smoking unless told to do so by your health care  provider. What things can I do to make it easier to quit? Quitting smoking might feel overwhelming at first, but there is a lot that you can do to make it easier. Take these important actions:  Reach out to your family and friends and ask that they support and encourage you during this time. Call telephone quitlines, reach out to support groups, or work with a counselor for support.  Ask people who smoke to avoid smoking around you.  Avoid places that trigger you to smoke, such as bars, parties, or smoke-break areas at work.  Spend time around people who do not smoke.  Lessen stress in your life, because stress can be a smoking trigger for some people. To lessen stress, try: ? Exercising regularly. ? Deep-breathing exercises. ? Yoga. ? Meditating. ? Performing a body scan. This involves closing your eyes, scanning your body from head to toe, and noticing which parts of your body are particularly tense. Purposefully relax the muscles in those areas.  Download or purchase mobile phone or tablet apps (applications) that can help you stick to your quit plan by providing reminders, tips, and encouragement. There are many free apps, such as QuitGuide from the CDC (Centers for Disease Control and Prevention). You can find other support for quitting smoking (smoking cessation) through smokefree.gov and other websites.  How will I feel when I quit smoking? Within the first 24 hours of quitting smoking, you may start to feel some withdrawal symptoms. These symptoms are usually most noticeable 2-3 days after quitting, but they usually do not last beyond 2-3 weeks. Changes or symptoms that you might experience include:  Mood swings.  Restlessness, anxiety, or irritation.  Difficulty concentrating.  Dizziness.  Strong cravings for sugary foods in addition to nicotine.  Mild weight gain.  Constipation.  Nausea.  Coughing or a sore throat.  Changes in how your medicines work in your  body.  A depressed mood.  Difficulty sleeping (insomnia).  After the first 2-3 weeks of quitting, you may start to notice more positive results, such as:  Improved sense of smell and taste.  Decreased coughing and sore throat.  Slower heart rate.  Lower blood pressure.  Clearer skin.  The ability to breathe more easily.  Fewer sick days.  Quitting smoking is very challenging for most people. Do not get discouraged if you are not successful the first time. Some people need to make many attempts to quit before they achieve long-term success. Do your best to stick to your quit plan, and talk with your health care provider if you have any questions or concerns. This information is not intended to replace advice given to you by your health care provider. Make sure you discuss any questions you have with your health care provider. Document Released: 05/13/2001 Document Revised: 01/15/2016 Document Reviewed: 10/03/2014 Elsevier Interactive Patient Education  2018 Elsevier Inc.  

## 2017-06-12 ENCOUNTER — Other Ambulatory Visit: Payer: Self-pay

## 2017-06-12 ENCOUNTER — Telehealth: Payer: Self-pay | Admitting: Family Medicine

## 2017-06-12 DIAGNOSIS — F17210 Nicotine dependence, cigarettes, uncomplicated: Secondary | ICD-10-CM

## 2017-06-12 MED ORDER — NICOTINE 21 MG/24HR TD PT24: 21.0000 mg | MEDICATED_PATCH | Freq: Every day | TRANSDERMAL | 0 refills | Status: DC

## 2017-06-12 NOTE — Telephone Encounter (Signed)
Pt needs refill per wife  nicotine (NICODERM CQ - DOSED IN MG/24 HOURS) 21 mg/24hr patch [[id number]]

## 2017-06-12 NOTE — Telephone Encounter (Signed)
Sent in to [facility]

## 2017-06-19 ENCOUNTER — Ambulatory Visit: Payer: BLUE CROSS/BLUE SHIELD | Admitting: Family Medicine

## 2017-06-19 ENCOUNTER — Encounter: Payer: Self-pay | Admitting: Family Medicine

## 2017-06-19 VITALS — BP 120/62 | HR 60 | Ht 70.0 in | Wt 213.0 lb

## 2017-06-19 DIAGNOSIS — L723 Sebaceous cyst: Secondary | ICD-10-CM

## 2017-06-19 DIAGNOSIS — L089 Local infection of the skin and subcutaneous tissue, unspecified: Secondary | ICD-10-CM

## 2017-06-19 MED ORDER — CEPHALEXIN 500 MG PO CAPS: 500.0000 mg | ORAL_CAPSULE | Freq: Four times a day (QID) | ORAL | 0 refills | Status: DC

## 2017-06-19 NOTE — Progress Notes (Signed)
Name: Darren Page   MRN: [id number]    DOB: 08-15-1955   Date:06/19/2017       Progress Note  Subjective  Chief Complaint  Chief Complaint  Patient presents with  . Cyst    Patient presents with draining/infected sebacceous cyst on posterior left shoulder for the past 2 weeks.  persistence of symptoms and infectious concerns necessitate incision and drainage.    No problem-specific Assessment & Plan notes found for this encounter.   Past Medical History:  Diagnosis Date  . COPD (chronic obstructive pulmonary disease) ([place])   . Hyperlipidemia   . Hypertension     Past Surgical History:  Procedure Laterality Date  . SKIN CANCER EXCISION     mohs nose    History reviewed. No pertinent family history.  Social History   Socioeconomic History  . Marital status: Married    Spouse name: Not on file  . Number of children: Not on file  . Years of education: Not on file  . Highest education level: Not on file  Social Needs  . Financial resource strain: Not on file  . Food insecurity - worry: Not on file  . Food insecurity - inability: Not on file  . Transportation needs - medical: Not on file  . Transportation needs - non-medical: Not on file  Occupational History  . Not on file  Tobacco Use  . Smoking status: Current Some Day Smoker    Types: Cigarettes  . Smokeless tobacco: Never Used  . Tobacco comment: patient on nicoderm patch  Substance and Sexual Activity  . Alcohol use: Not on file  . Drug use: Not on file  . Sexual activity: Not Currently  Other Topics Concern  . Not on file  Social History Narrative  . Not on file    Allergies  Allergen Reactions  . Atorvastatin Rash    myalgias    Outpatient Medications Prior to Visit  Medication Sig Dispense Refill  . acetaminophen (TYLENOL) 500 MG tablet Take 1,000 mg by mouth.    Marland Kitchen aspirin 81 MG chewable tablet Chew 81 mg by mouth.    . metoprolol succinate (TOPROL-XL) 25 MG 24 hr tablet Take 25  mg by mouth.    . nicotine (NICODERM CQ - DOSED IN MG/24 HOURS) 21 mg/24hr patch Place 1 patch (21 mg total) onto the skin daily. 28 patch 0  . nicotine polacrilex (NICORETTE) 4 MG gum Take 1 each (4 mg total) by mouth as needed for smoking cessation. 100 tablet 0  . nitroGLYCERIN (NITROSTAT) 0.4 MG SL tablet Place 0.4 mg under the tongue.    . Omega-3 1000 MG CAPS Take 1 capsule by mouth daily.     No facility-administered medications prior to visit.     Review of Systems  Constitutional: Negative for chills, fever, malaise/fatigue and weight loss.  HENT: Negative for ear discharge, ear pain and sore throat.   Eyes: Negative for blurred vision.  Respiratory: Negative for cough, sputum production, shortness of breath and wheezing.   Cardiovascular: Negative for chest pain, palpitations and leg swelling.  Gastrointestinal: Negative for abdominal pain, blood in stool, constipation, diarrhea, heartburn, melena and nausea.  Genitourinary: Negative for dysuria, frequency, hematuria and urgency.  Musculoskeletal: Negative for back pain, joint pain, myalgias and neck pain.  Skin: Negative for rash.  Neurological: Negative for dizziness, tingling, sensory change, focal weakness and headaches.  Endo/Heme/Allergies: Negative for environmental allergies and polydipsia. Does not bruise/bleed easily.  Psychiatric/Behavioral: Negative for depression and suicidal  ideas. The patient is not nervous/anxious and does not have insomnia.      Objective  Vitals:   06/19/17 0900  BP: 120/62  Pulse: 60  Weight: 213 lb (96.6 kg)  Height: 5\' 10"  (1.778 m)    Physical Exam  Constitutional: He is oriented to person, place, and time and well-developed, well-nourished, and in no distress.  HENT:  Head: Normocephalic.  Right Ear: External ear normal.  Left Ear: External ear normal.  Nose: Nose normal.  Mouth/Throat: Oropharynx is clear and moist.  Eyes: Conjunctivae and EOM are normal. Pupils are equal,  round, and reactive to light. Right eye exhibits no discharge. Left eye exhibits no discharge. No scleral icterus.  Neck: Normal range of motion. Neck supple. No JVD present. No tracheal deviation present. No thyromegaly present.  Cardiovascular: Normal rate, regular rhythm, normal heart sounds and intact distal pulses. Exam reveals no gallop and no friction rub.  No murmur heard. Pulmonary/Chest: Breath sounds normal. No respiratory distress. He has no wheezes. He has no rales.  Abdominal: Soft. Bowel sounds are normal. He exhibits no mass. There is no hepatosplenomegaly. There is no tenderness. There is no rebound, no guarding and no CVA tenderness.  Musculoskeletal: Normal range of motion. He exhibits no edema or tenderness.  Lymphadenopathy:    He has no cervical adenopathy.  Neurological: He is alert and oriented to person, place, and time. He has normal sensation, normal strength, normal reflexes and intact cranial nerves. No cranial nerve deficit.  Skin: Skin is warm. No rash noted.  Erythema/draining cyst with foul smelling sebacceous material.  Psychiatric: Mood and affect normal.  Nursing note and vitals reviewed.     Assessment & Plan  Problem List Items Addressed This Visit    None    Visit Diagnoses    Infected sebaceous cyst of skin    -  Primary   Relevant Medications   cephALEXin (KEFLEX) 500 MG capsule   Other Relevant Orders   Ambulatory referral to General Surgery      Meds ordered this encounter  Medications  . cephALEXin (KEFLEX) 500 MG capsule    Sig: Take 1 capsule (500 mg total) by mouth 4 (four) times daily.    Dispense:  40 capsule    Refill:  0   Pt to see Dr Burt Knack on Friday 25th   Dr. [facility]  06/19/17

## 2017-06-19 NOTE — Patient Instructions (Signed)
Epidermal Cyst An epidermal cyst is sometimes called an epidermal inclusion cyst or an infundibular cyst. It is a sac made of skin tissue. The sac contains a substance called keratin. Keratin is a protein that is normally secreted through the hair follicles. When keratin becomes trapped in the top layer of skin (epidermis), it can form an epidermal cyst. Epidermal cysts are usually found on the face, neck, trunk, and genitals. These cysts are usually harmless (benign), and they may not cause symptoms unless they become infected. It is important not to pop epidermal cysts yourself. What are the causes? This condition may be caused by:  A blocked hair follicle.  A hair that curls and re-enters the skin instead of growing straight out of the skin (ingrown hair).  A blocked pore.  Irritated skin.  An injury to the skin.  Certain conditions that are passed along from parent to child (inherited).  Human papillomavirus (HPV).  What increases the risk? The following factors may make you more likely to develop an epidermal cyst:  Having acne.  Being overweight.  Wearing tight clothing.  What are the signs or symptoms? The only symptom of this condition may be a small, painless lump underneath the skin. When an epidermal cyst becomes infected, symptoms may include:  Redness.  Inflammation.  Tenderness.  Warmth.  Fever.  Keratin draining from the cyst. Keratin may look like a grayish-white, bad-smelling substance.  Pus draining from the cyst.  How is this diagnosed? This condition is diagnosed with a physical exam. In some cases, you may have a sample of tissue (biopsy) taken from your cyst to be examined under a microscope or tested for bacteria. You may be referred to a health care provider who specializes in skin care (dermatologist). How is this treated? In many cases, epidermal cysts go away on their own without treatment. If a cyst becomes infected, treatment may  include:  Opening and draining the cyst. After draining, minor surgery to remove the rest of the cyst may be done.  Antibiotic medicine to help prevent infection.  Injections of medicines (steroids) that help to reduce inflammation.  Surgery to remove the cyst. Surgery may be done if: ? The cyst becomes large. ? The cyst bothers you. ? There is a chance that the cyst could turn into cancer.  Follow these instructions at home:  Take over-the-counter and prescription medicines only as told by your health care provider.  If you were prescribed an antibiotic, use it as told by your health care provider. Do not stop using the antibiotic even if you start to feel better.  Keep the area around your cyst clean and dry.  Wear loose, dry clothing.  Do not try to pop your cyst.  Avoid touching your cyst.  Check your cyst every day for signs of infection.  Keep all follow-up visits as told by your health care provider. This is important. How is this prevented?  Wear clean, dry, clothing.  Avoid wearing tight clothing.  Keep your skin clean and dry. Shower or take baths every day.  Wash your body with a benzoyl peroxide wash when you shower or bathe. Contact a health care provider if:  Your cyst develops symptoms of infection.  Your condition is not improving or is getting worse.  You develop a cyst that looks different from other cysts you have had.  You have a fever. Get help right away if:  Redness spreads from the cyst into the surrounding area. This information is   not intended to replace advice given to you by your health care provider. Make sure you discuss any questions you have with your health care provider. Document Released: 04/19/2004 Document Revised: 01/16/2016 Document Reviewed: 03/21/2015 Elsevier Interactive Patient Education  2018 Elsevier Inc.  

## 2017-06-22 ENCOUNTER — Ambulatory Visit: Payer: BLUE CROSS/BLUE SHIELD | Admitting: Surgery

## 2017-06-26 ENCOUNTER — Ambulatory Visit (INDEPENDENT_AMBULATORY_CARE_PROVIDER_SITE_OTHER): Payer: BLUE CROSS/BLUE SHIELD | Admitting: Surgery

## 2017-06-26 ENCOUNTER — Encounter: Payer: Self-pay | Admitting: Surgery

## 2017-06-26 VITALS — BP 135/87 | HR 52 | Ht 68.0 in | Wt 210.0 lb

## 2017-06-26 DIAGNOSIS — L723 Sebaceous cyst: Secondary | ICD-10-CM

## 2017-06-26 MED ORDER — SULFAMETHOXAZOLE-TRIMETHOPRIM 800-160 MG PO TABS: 1.0000 | ORAL_TABLET | Freq: Two times a day (BID) | ORAL | 0 refills | Status: DC

## 2017-06-26 NOTE — Progress Notes (Signed)
Darren Page is an 62 y.o. male.   Chief Complaint: Shoulder mass left shoulder  Consult requested by Dr. Ronnald Ramp  HPI: This patient with shoulder mass on the left shoulder.  It has been there for many years.  In the last 3 weeks it is started draining.  It causes him no pain.  He has no fevers or chills.  He has had previous cyst excised.  Past Medical History:  Diagnosis Date  . COPD (chronic obstructive pulmonary disease) ([place])   . Hyperlipidemia   . Hypertension     Past Surgical History:  Procedure Laterality Date  . SKIN CANCER EXCISION     mohs nose    No family history on file. Social History:  reports that he has been smoking cigarettes.  he has never used smokeless tobacco. His alcohol and drug histories are not on file.  Allergies:  Allergies  Allergen Reactions  . Atorvastatin Rash    myalgias     (Not in a hospital admission)   Review of Systems:   Review of Systems  Constitutional: Negative.   HENT: Negative.   Eyes: Negative.   Respiratory: Negative.   Cardiovascular: Negative.   Gastrointestinal: Negative.   Genitourinary: Negative.   Musculoskeletal: Negative.   Skin: Negative.   Neurological: Negative.   Endo/Heme/Allergies: Negative.   Psychiatric/Behavioral: Negative.     Physical Exam:  Physical Exam  Constitutional: He is oriented to person, place, and time and well-developed, well-nourished, and in no distress. No distress.  HENT:  Head: Normocephalic and atraumatic.  Eyes: Pupils are equal, round, and reactive to light. Right eye exhibits no discharge. Left eye exhibits no discharge. No scleral icterus.  Neck: Normal range of motion.  Cardiovascular: Normal rate, regular rhythm and normal heart sounds.  Pulmonary/Chest: Effort normal. No respiratory distress. He has no wheezes.  Abdominal: Soft. There is no tenderness.  Musculoskeletal: Normal range of motion. He exhibits no edema or tenderness.  Lymphadenopathy:    He  has no cervical adenopathy.  Neurological: He is alert and oriented to person, place, and time.  Skin: Skin is warm and dry. No rash noted. He is not diaphoretic. No erythema.  Right shoulder mass measuring approximately 2-1/2 cm.  There is some drainage and minimal erythema if any  Psychiatric: Mood and affect normal.  Vitals reviewed.   There were no vitals taken for this visit.    No results found for this or any previous visit (from the past 48 hour(s)). No results found.   Assessment/Plan Shoulder mass left shoulder.  I would like to treat this with some antibiotics and then excise it.  It is draining some minimal purulence and would benefit from shrinkage prior to excision.  The rationale for this was discussed with the patient the options of observation reviewed and the risks of bleeding infection recurrence and cosmetic deformity including scar were all reviewed with him he understood and agreed to proceed.  The Keflex that he is on does not seem to be working and may benefit from changing to Bactrim as this is likely a staph (possible MRSA).  Vision is a later date will be performed. Florene Glen, MD, FACS

## 2017-06-26 NOTE — Patient Instructions (Signed)
Return in two week. Rx sent.

## 2017-06-29 ENCOUNTER — Telehealth: Payer: Self-pay

## 2017-06-29 NOTE — Telephone Encounter (Signed)
Called patient's wife, Pamala Hurry and left her a detailed message.   Dr. Hampton Abbot will not be able to do her husband's procedure on 07/03/2017. Patient will need to see Dr. Burt Knack again since he is his original surgeon. So when they call back, please reschedule appointment with Dr. Burt Knack. Thank you.

## 2017-06-29 NOTE — Telephone Encounter (Signed)
-----   Message from Olean Ree, MD sent at 06/29/2017  9:39 AM EST ----- Regarding: RE: Procedure Hi Francia Greaves saw this patient on 1/25, not me.  Thanks,  Jose   ----- Message ----- From: Wayna Chalet, [place] Sent: 06/29/2017   8:31 AM To: Olean Ree, MD Subject: Procedure                                      Dr. Hampton Abbot,  Patient's wife called stating that her husband will not be able to come in on 07/07/2017 to have his procedure (excision of sebaceous cyst on his back) done because he will be out of town. The only dates that she was able to give me were: 07/03/2017 and 07/10/2017. I told her that we like the patient to follow up with the provider that evaluated him. However, the wife insisted and stated that her husband has a busy schedule and traveled a lot and would like for him to have this cyst removed as soon as possible. I told her that I would ask you if you were able to do it. She understood. Please let me know if you would be able to perform his procedure or not. Thank you.

## 2017-07-03 ENCOUNTER — Ambulatory Visit: Payer: Self-pay | Admitting: Surgery

## 2017-07-06 ENCOUNTER — Other Ambulatory Visit: Payer: Self-pay | Admitting: Surgery

## 2017-07-06 ENCOUNTER — Encounter: Payer: Self-pay | Admitting: Surgery

## 2017-07-06 ENCOUNTER — Ambulatory Visit (INDEPENDENT_AMBULATORY_CARE_PROVIDER_SITE_OTHER): Payer: BLUE CROSS/BLUE SHIELD | Admitting: Surgery

## 2017-07-06 VITALS — BP 135/82 | HR 61 | Temp 97.6°F | Wt 212.0 lb

## 2017-07-06 DIAGNOSIS — L720 Epidermal cyst: Secondary | ICD-10-CM | POA: Diagnosis not present

## 2017-07-06 DIAGNOSIS — L723 Sebaceous cyst: Secondary | ICD-10-CM

## 2017-07-06 NOTE — Patient Instructions (Addendum)
Please do not remove your dressing until tomorrow. Please do not shower until tomorrow. You will be able to remove the dressing then and once you shower, please place a new dressing.  We will see you back in about two weeks. Please give [place] a call when you come back in town so we could remove your sutures.

## 2017-07-06 NOTE — Addendum Note (Signed)
Addended by: Wayna Chalet on: 07/06/2017 12:00 PM   Modules accepted: Orders

## 2017-07-06 NOTE — Progress Notes (Signed)
Outpatient Surgical Follow Up  07/06/2017  Darren Page is an 62 y.o. male.   CC: Infected shoulder cyst  HPI: This patient with an infected shoulder cyst that was seen previously and is placed on antibiotics.  It had been draining.  This is on the left shoulder.  Patient is fairly adamant about wanting it removed in the office.  He has no further pain or drainage and no fevers or chills.  Past Medical History:  Diagnosis Date  . COPD (chronic obstructive pulmonary disease) ([place])   . Hyperlipidemia   . Hypertension     Past Surgical History:  Procedure Laterality Date  . SKIN CANCER EXCISION     mohs nose    No family history on file.  Social History:  reports that he has been smoking cigarettes.  he has never used smokeless tobacco. His alcohol and drug histories are not on file.  Allergies:  Allergies  Allergen Reactions  . Atorvastatin Rash    myalgias    Medications reviewed.   Review of Systems:   Review of Systems  Constitutional: Negative.   HENT: Negative.   Eyes: Negative.   Respiratory: Negative.   Cardiovascular: Negative.   Gastrointestinal: Negative.   Genitourinary: Negative.   Musculoskeletal: Negative.   Skin: Negative.   Neurological: Negative.   Endo/Heme/Allergies: Negative.   Psychiatric/Behavioral: Negative.      Physical Exam:  There were no vitals taken for this visit.  Physical Exam  Constitutional: He is well-developed, well-nourished, and in no distress. No distress.  HENT:  Head: Normocephalic and atraumatic.  Neck: Normal range of motion.  Lymphadenopathy:    He has no cervical adenopathy.  Skin: Skin is warm and dry. No rash noted. He is not diaphoretic. No erythema.  Mass on left shoulder is much smaller than it and there is no erythema.  There is minimal expressible purulence.  Size of this is shrunken considerably to approximately 1 cm  Vitals reviewed.     No results found for this or any previous visit  (from the past 48 hour(s)). No results found.  Assessment/Plan:  Discussed with the patient excision.  He is very adamant about wanting it to be removed today in the office and does not want to go to a surgery center.  It has shrunken considerably with antibiotics.  I agree that removing it in the office could be attempted.  The rationale for this was discussed the options of observation versus surgery center utilization was reviewed.  He prefers and is fairly adamant about having it done today in the office.  We discussed the risk of recurrence and open wound.  He has travel coming up and we discussed that as well.  He wants to remove removed today.  Procedure note: Informed consent was obtained concerning the risk of bleeding infection recurrence and open wound as well as cosmetic deformity.  Surgical pause was held.  The patient was then prepped and draped in a sterile fashion and local anesthetic was infiltrated.  A lenticular shaped incision was executed and the cyst was elevated.  It was larger than expected and came out piecemeal.  There was no sign of infection however.  Additional lidocaine was placed.  Once assuring that hemostasis was adequate the wound was closed with deep layer of 3-0 Vicryl followed by interrupted horizontal mattress sutures of 3-0 nylon and simple sutures of 3-0 nylon at the skin level.  A sterile dressing was placed.  The incision measured approximately 2  cm to 2-1/2 cm in the closure was intermediate depth.  The mass itself measured approximately 1-1/2 cm     Florene Glen, MD, FACS

## 2017-07-07 ENCOUNTER — Ambulatory Visit: Payer: Self-pay | Admitting: Surgery

## 2017-07-08 LAB — PATHOLOGY

## 2017-07-17 ENCOUNTER — Ambulatory Visit (INDEPENDENT_AMBULATORY_CARE_PROVIDER_SITE_OTHER): Payer: BLUE CROSS/BLUE SHIELD | Admitting: Surgery

## 2017-07-17 ENCOUNTER — Encounter: Payer: Self-pay | Admitting: Surgery

## 2017-07-17 DIAGNOSIS — Z872 Personal history of diseases of the skin and subcutaneous tissue: Secondary | ICD-10-CM

## 2017-07-17 NOTE — Patient Instructions (Signed)

## 2017-07-19 ENCOUNTER — Encounter: Payer: Self-pay | Admitting: Surgery

## 2017-07-19 DIAGNOSIS — Z872 Personal history of diseases of the skin and subcutaneous tissue: Secondary | ICD-10-CM | POA: Insufficient documentation

## 2017-07-19 HISTORY — DX: Personal history of diseases of the skin and subcutaneous tissue: Z87.2

## 2017-07-19 NOTE — Progress Notes (Signed)
Surgical Clinic Progress/Follow-up Note   HPI:  62 y.o. Male presents to clinic for post-op follow-up wound evaluation and removal of sutures 11 days s/p excision of a painful and recently infected Left shoulder cyst. Patient reports minimal pain and denies any drainage from wound, fever/chills, CP, or SOB.  Review of Systems:  Constitutional: denies any other weight loss, fever, chills, or sweats  Eyes: denies any other vision changes, history of eye injury  ENT: denies sore throat, hearing problems  Respiratory: denies shortness of breath, wheezing  Cardiovascular: denies chest pain, palpitations  Gastrointestinal: denies abdominal pain, N/V, or diarrhea Musculoskeletal: denies any other joint pains or cramps  Skin: Denies any other rashes or skin discolorations except as per HPI Neurological: denies any other headache, dizziness, weakness  Psychiatric: denies any other depression, anxiety  All other review of systems: otherwise negative   Vital Signs:  BP 132/80   Pulse 65   Temp 98 F (36.7 C) (Oral)   Ht 5\' 8"  (1.727 m)   Wt 210 lb (95.3 kg)   BMI 31.93 kg/m    Physical Exam:  Constitutional:  -- Normal body habitus  -- Awake, alert, and oriented x3  Eyes:  -- Pupils equally round and reactive to light  -- No scleral icterus  Ear, nose, throat:  -- No jugular venous distension  -- No nasal drainage, bleeding Pulmonary:  -- No crackles -- Equal breath sounds bilaterally -- Breathing non-labored at rest Cardiovascular:  -- S1, S2 present  -- No pericardial rubs  Gastrointestinal:  -- Soft, nontender, non-distended, no guarding/rebound  -- No abdominal masses appreciated, pulsatile or otherwise  Musculoskeletal / Integumentary:  -- Wounds or skin discoloration: Minimally-/non-tender Left shoulder post-surgical wound well-approximated with sutures in place and no peri-incisional erythema or drainage -- Extremities: B/L UE and LE FROM, hands and feet warm, no  edema  Neurologic:  -- Motor function: intact and symmetric  -- Sensation: intact and symmetric   Assessment:  62 y.o. yo Male with a problem list including...  Patient Active Problem List   Diagnosis Date Noted  . Coronary artery disease 11/22/2012  . Hypercholesterolemia 11/22/2012  . Tobacco use disorder 11/22/2012    presents to clinic for post-op follow-up evaluation and anticipated suture removal, doing well 11 Days s/p excision of painful and recently infected Left shoulder sebaceous/epidermoid inclusion cyst.  Plan:   - sutures removed uneventfully and Steri-strips applied   - reiterated importance of compliance with post-surgical instructions  - instructed to call office if any questions or concerns  - return to clinic as needed  All of the above recommendations were discussed with the patient, and all of patient's questions were answered to his expressed satisfaction.  -- Marilynne Drivers Rosana Hoes, MD, [place]: [facility] for exceptional care. Office: [phone]

## 2017-07-27 ENCOUNTER — Other Ambulatory Visit: Payer: Self-pay | Admitting: Family Medicine

## 2017-07-27 ENCOUNTER — Other Ambulatory Visit: Payer: Self-pay

## 2017-07-27 ENCOUNTER — Telehealth: Payer: Self-pay | Admitting: Family Medicine

## 2017-07-27 DIAGNOSIS — F17210 Nicotine dependence, cigarettes, uncomplicated: Secondary | ICD-10-CM

## 2017-07-27 MED ORDER — NICOTINE 21 MG/24HR TD PT24: 21.0000 mg | MEDICATED_PATCH | Freq: Every day | TRANSDERMAL | 0 refills | Status: DC

## 2017-07-27 NOTE — Telephone Encounter (Signed)
Wife called and said Mr Kupper lvm three times with tara to fill nicotine (NICODERM CQ - DOSED IN MG/24 HOURS) 21 mg/24hr patch  He will need this sent in today so he can have them before he leaves tomorrow morning to go out of town.  Pharmacy:  [facility] [address] [place]

## 2017-07-27 NOTE — Telephone Encounter (Signed)
Left 3 messages on my VM.Spoke to wife and LM with patient that we are getting these but we need time to approve and process

## 2017-07-27 NOTE — Telephone Encounter (Signed)
Patient message for jones patient.

## 2017-08-27 ENCOUNTER — Other Ambulatory Visit: Payer: Self-pay

## 2017-08-27 DIAGNOSIS — F17210 Nicotine dependence, cigarettes, uncomplicated: Secondary | ICD-10-CM

## 2017-08-27 MED ORDER — NICOTINE 21 MG/24HR TD PT24: 21.0000 mg | MEDICATED_PATCH | Freq: Every day | TRANSDERMAL | 0 refills | Status: DC

## 2017-08-28 DIAGNOSIS — I2510 Atherosclerotic heart disease of native coronary artery without angina pectoris: Secondary | ICD-10-CM | POA: Diagnosis not present

## 2017-09-04 DIAGNOSIS — Z789 Other specified health status: Secondary | ICD-10-CM | POA: Diagnosis not present

## 2017-09-04 DIAGNOSIS — I2510 Atherosclerotic heart disease of native coronary artery without angina pectoris: Secondary | ICD-10-CM | POA: Diagnosis not present

## 2017-09-04 DIAGNOSIS — Z6830 Body mass index (BMI) 30.0-30.9, adult: Secondary | ICD-10-CM | POA: Diagnosis not present

## 2017-09-04 DIAGNOSIS — E785 Hyperlipidemia, unspecified: Secondary | ICD-10-CM | POA: Diagnosis not present

## 2017-09-28 ENCOUNTER — Telehealth: Payer: Self-pay | Admitting: Family Medicine

## 2017-09-28 ENCOUNTER — Other Ambulatory Visit: Payer: Self-pay

## 2017-09-28 DIAGNOSIS — Z716 Tobacco abuse counseling: Secondary | ICD-10-CM

## 2017-09-28 MED ORDER — NICOTINE 14 MG/24HR TD PT24: 14.0000 mg | MEDICATED_PATCH | Freq: Every day | TRANSDERMAL | 0 refills | Status: DC

## 2017-09-28 NOTE — Telephone Encounter (Signed)
Sent in

## 2017-09-28 NOTE — Telephone Encounter (Signed)
Pt needs refill sent to [facility] in [place]   Wants 14mg s called in?? Please advise

## 2017-10-27 ENCOUNTER — Other Ambulatory Visit: Payer: Self-pay

## 2017-10-27 ENCOUNTER — Telehealth: Payer: Self-pay

## 2017-10-27 DIAGNOSIS — Z716 Tobacco abuse counseling: Secondary | ICD-10-CM

## 2017-10-27 MED ORDER — NICOTINE 14 MG/24HR TD PT24: 14.0000 mg | MEDICATED_PATCH | Freq: Every day | TRANSDERMAL | 0 refills | Status: DC

## 2017-10-27 NOTE — Telephone Encounter (Signed)
Pt called in requesting the 14 mg patches- sent in and told to come in next time. Also decrease down to 7mg 

## 2017-10-30 MED ORDER — NICOTINE 14 MG/24HR TD PT24: 14.0000 mg | MEDICATED_PATCH | Freq: Every day | TRANSDERMAL | 0 refills | Status: DC

## 2017-10-30 NOTE — Progress Notes (Unsigned)
Pt asked for Rx to be sent to a different pharmacy

## 2018-01-01 DIAGNOSIS — Z789 Other specified health status: Secondary | ICD-10-CM | POA: Diagnosis not present

## 2018-01-01 DIAGNOSIS — I2510 Atherosclerotic heart disease of native coronary artery without angina pectoris: Secondary | ICD-10-CM | POA: Diagnosis not present

## 2018-01-01 DIAGNOSIS — E785 Hyperlipidemia, unspecified: Secondary | ICD-10-CM | POA: Diagnosis not present

## 2018-01-01 DIAGNOSIS — M79601 Pain in right arm: Secondary | ICD-10-CM | POA: Diagnosis not present

## 2018-01-01 DIAGNOSIS — F17210 Nicotine dependence, cigarettes, uncomplicated: Secondary | ICD-10-CM | POA: Diagnosis not present

## 2018-01-04 ENCOUNTER — Telehealth: Payer: Self-pay

## 2018-01-04 ENCOUNTER — Other Ambulatory Visit: Payer: Self-pay

## 2018-01-04 DIAGNOSIS — Z716 Tobacco abuse counseling: Secondary | ICD-10-CM

## 2018-01-04 MED ORDER — NICOTINE 14 MG/24HR TD PT24: 14.0000 mg | MEDICATED_PATCH | Freq: Every day | TRANSDERMAL | 0 refills | Status: DC

## 2018-01-04 NOTE — Telephone Encounter (Signed)
Wife called to request Patches at 14mg  for pt due to "he's out of town". We sent in the 14mg  one last time to the [facility] in [place]. He is supposed to cut down to 7mg  next time filling as well as get an appt scheduled with Dr Ronnald Ramp. I left message on pt's cell phone concerning this info, as well as told him he will have to go to a [facility] closer to him and request a transfer in the Rx

## 2018-01-26 DIAGNOSIS — M79601 Pain in right arm: Secondary | ICD-10-CM | POA: Diagnosis not present

## 2018-01-26 DIAGNOSIS — I2510 Atherosclerotic heart disease of native coronary artery without angina pectoris: Secondary | ICD-10-CM | POA: Diagnosis not present

## 2018-01-26 DIAGNOSIS — E785 Hyperlipidemia, unspecified: Secondary | ICD-10-CM | POA: Diagnosis not present

## 2018-01-26 DIAGNOSIS — M79602 Pain in left arm: Secondary | ICD-10-CM | POA: Diagnosis not present

## 2018-01-26 DIAGNOSIS — I25118 Atherosclerotic heart disease of native coronary artery with other forms of angina pectoris: Secondary | ICD-10-CM | POA: Diagnosis not present

## 2018-01-26 DIAGNOSIS — F17210 Nicotine dependence, cigarettes, uncomplicated: Secondary | ICD-10-CM | POA: Diagnosis not present

## 2018-02-03 DIAGNOSIS — M79601 Pain in right arm: Secondary | ICD-10-CM | POA: Insufficient documentation

## 2018-02-03 DIAGNOSIS — R9439 Abnormal result of other cardiovascular function study: Secondary | ICD-10-CM | POA: Insufficient documentation

## 2018-02-12 DIAGNOSIS — I2510 Atherosclerotic heart disease of native coronary artery without angina pectoris: Secondary | ICD-10-CM | POA: Diagnosis not present

## 2018-02-12 DIAGNOSIS — M79602 Pain in left arm: Secondary | ICD-10-CM | POA: Diagnosis not present

## 2018-02-12 DIAGNOSIS — R9439 Abnormal result of other cardiovascular function study: Secondary | ICD-10-CM | POA: Diagnosis not present

## 2018-02-12 DIAGNOSIS — M79601 Pain in right arm: Secondary | ICD-10-CM | POA: Diagnosis not present

## 2018-02-15 DIAGNOSIS — F17210 Nicotine dependence, cigarettes, uncomplicated: Secondary | ICD-10-CM | POA: Diagnosis not present

## 2018-02-15 DIAGNOSIS — Z6830 Body mass index (BMI) 30.0-30.9, adult: Secondary | ICD-10-CM | POA: Diagnosis not present

## 2018-02-15 DIAGNOSIS — E785 Hyperlipidemia, unspecified: Secondary | ICD-10-CM | POA: Diagnosis not present

## 2018-02-15 DIAGNOSIS — I25118 Atherosclerotic heart disease of native coronary artery with other forms of angina pectoris: Secondary | ICD-10-CM | POA: Diagnosis not present

## 2018-04-06 DIAGNOSIS — Z6830 Body mass index (BMI) 30.0-30.9, adult: Secondary | ICD-10-CM | POA: Diagnosis not present

## 2018-04-06 DIAGNOSIS — I2510 Atherosclerotic heart disease of native coronary artery without angina pectoris: Secondary | ICD-10-CM | POA: Diagnosis not present

## 2018-04-06 DIAGNOSIS — F17210 Nicotine dependence, cigarettes, uncomplicated: Secondary | ICD-10-CM | POA: Diagnosis not present

## 2018-04-06 DIAGNOSIS — M2550 Pain in unspecified joint: Secondary | ICD-10-CM | POA: Diagnosis not present

## 2018-07-31 ENCOUNTER — Ambulatory Visit
Admission: EM | Admit: 2018-07-31 | Discharge: 2018-07-31 | Disposition: A | Discharge status: Home / Self Care | Payer: BLUE CROSS/BLUE SHIELD | Attending: Family Medicine | Admitting: Family Medicine

## 2018-07-31 ENCOUNTER — Encounter: Payer: Self-pay | Admitting: Emergency Medicine

## 2018-07-31 ENCOUNTER — Other Ambulatory Visit: Payer: Self-pay

## 2018-07-31 DIAGNOSIS — R059 Cough, unspecified: Secondary | ICD-10-CM

## 2018-07-31 DIAGNOSIS — J0100 Acute maxillary sinusitis, unspecified: Secondary | ICD-10-CM

## 2018-07-31 DIAGNOSIS — R05 Cough: Secondary | ICD-10-CM

## 2018-07-31 MED ORDER — HYDROCOD POLST-CPM POLST ER 10-8 MG/5ML PO SUER: 5.0000 mL | Freq: Every evening | ORAL | 0 refills | Status: DC | PRN

## 2018-07-31 MED ORDER — DOXYCYCLINE HYCLATE 100 MG PO CAPS: 100.0000 mg | ORAL_CAPSULE | Freq: Two times a day (BID) | ORAL | 0 refills | Status: DC

## 2018-07-31 NOTE — ED Triage Notes (Signed)
Patient c/o sinus congestion and pressure and cough for the past 2 weeks.  Patient denies fevers.

## 2018-07-31 NOTE — ED Provider Notes (Signed)
[facility] ____________________________________________  Time seen: Approximately 11:36 AM  I have reviewed the triage vital signs and the nursing notes.   HISTORY  Chief Complaint Sinus Problem and Cough   HPI Darren Page is a 63 y.o. male presenting for evaluation of 2 weeks of nasal congestion, nasal drainage and sinus pressure.  States symptoms initially started off with a cold, but reports the nasal congestion has continued to increase.  States cough, especially at night with associated postnasal drainage.  States cough does intermittently disrupt sleep.  Denies known fevers.  States recently around grandchildren who had colds.  Unresolved with over-the-counter cough congestion medication.  Has overall continued to eat and drink well.  Has continued been active.  Denies other complaints.  Juline Patch, MD: PCP   Past Medical History:  Diagnosis Date  . COPD (chronic obstructive pulmonary disease) ([place])   . History of sebaceous cyst 07/19/2017   Left shoulder  . Hyperlipidemia   . Hypertension     Patient Active Problem List   Diagnosis Date Noted  . Coronary artery disease 11/22/2012  . Hypercholesterolemia 11/22/2012  . Tobacco use disorder 11/22/2012    Past Surgical History:  Procedure Laterality Date  . SKIN CANCER EXCISION     mohs nose     No current facility-administered medications for this encounter.   Current Outpatient Medications:  .  aspirin 81 MG chewable tablet, Chew 81 mg by mouth., Disp: , Rfl:  .  metoprolol succinate (TOPROL-XL) 25 MG 24 hr tablet, Take 25 mg by mouth., Disp: , Rfl:  .  Omega-3 1000 MG CAPS, Take 1 capsule by mouth daily., Disp: , Rfl:  .  acetaminophen (TYLENOL) 500 MG tablet, Take 1,000 mg by mouth., Disp: , Rfl:  .  chlorpheniramine-HYDROcodone (TUSSIONEX PENNKINETIC ER) 10-8 MG/5ML SUER, Take 5 mLs by mouth at bedtime as needed for cough. do not drive or operate machinery while taking as can  cause drowsiness., Disp: 50 mL, Rfl: 0 .  doxycycline (VIBRAMYCIN) 100 MG capsule, Take 1 capsule (100 mg total) by mouth 2 (two) times daily., Disp: 20 capsule, Rfl: 0 .  nicotine (NICODERM CQ) 14 mg/24hr patch, Place 1 patch (14 mg total) onto the skin daily., Disp: 28 patch, Rfl: 0 .  nicotine polacrilex (NICORETTE) 4 MG gum, Take 1 each (4 mg total) by mouth as needed for smoking cessation., Disp: 100 tablet, Rfl: 0 .  nitroGLYCERIN (NITROSTAT) 0.4 MG SL tablet, Place 0.4 mg under the tongue., Disp: , Rfl:  .  rosuvastatin (CRESTOR) 20 MG tablet, , Disp: , Rfl:   Allergies Atorvastatin  Family History  Problem Relation Age of Onset  . Cancer Mother   . Healthy Father     Social History Social History   Tobacco Use  . Smoking status: Former Smoker    Types: Cigarettes  . Smokeless tobacco: Never Used  . Tobacco comment: patient on nicoderm patch  Substance Use Topics  . Alcohol use: No    Frequency: Never  . Drug use: No    Review of Systems Constitutional: No fever ENT: No sore throat. As above.  Cardiovascular: Denies chest pain. Respiratory: Denies shortness of breath. Gastrointestinal: No abdominal pain.   Musculoskeletal: Negative for back pain. Skin: Negative for rash.   ____________________________________________   PHYSICAL EXAM:  VITAL SIGNS: ED Triage Vitals  Enc Vitals Group     BP 07/31/18 0926 127/83     Pulse Rate 07/31/18 0926 (!) 58  Resp 07/31/18 0926 16     Temp 07/31/18 0926 98.2 F (36.8 C)     Temp Source 07/31/18 0926 Oral     SpO2 07/31/18 0926 98 %     Weight 07/31/18 0922 205 lb (93 kg)     Height 07/31/18 0922 5\' 11"  (1.803 m)     Head Circumference --      Peak Flow --      Pain Score 07/31/18 0922 3     Pain Loc --      Pain Edu? --      Excl. in [place]? --    Constitutional: Alert and oriented. Well appearing and in no acute distress. Eyes: Conjunctivae are normal.  Head: Atraumatic.Mild to moderate tenderness to  palpation bilateral maxillary sinuses. No frontal sinus tenderness palpation no swelling. No erythema.   Ears: no erythema, normal TMs bilaterally.   Nose: nasal congestion    Mouth/Throat: Mucous membranes are moist. Oropharynx non-erythematous.No tonsillar swelling or exudate.  Neck: No stridor.  No cervical spine tenderness to palpation. Hematological/Lymphatic/Immunilogical: No cervical lymphadenopathy. Cardiovascular: Normal rate, regular rhythm. Grossly normal heart sounds.  Good peripheral circulation. Respiratory: Normal respiratory effort.  No retractions. No wheezes, rales or rhonchi. Good air movement.  Musculoskeletal: Steady gait. Neurologic:  Normal speech and language. No gait instability. Skin:  Skin is warm, dry and intact. No rash noted. Psychiatric: Mood and affect are normal. Speech and behavior are normal. ___________________________________________   LABS (all labs ordered are listed, but only abnormal results are displayed)  Labs Reviewed - No data to display PROCEDURES Procedures    INITIAL IMPRESSION / ASSESSMENT AND PLAN / ED COURSE  Pertinent labs & imaging results that were available during my care of the patient were reviewed by me and considered in my medical decision making (see chart for details).  Well-appearing patient.  No acute distress.  Suspect recent viral respiratory infection.  Suspect secondary sinusitis.  Will treat with oral doxycycline, PRN Tussionex at night.  Continue over-the-counter supportive care.  Rest, fluids.Discussed indication, risks and benefits of medications with patient.  Discussed follow up with Primary care physician this week. Discussed follow up and return parameters including no resolution or any worsening concerns. Patient verbalized understanding and agreed to plan.   ____________________________________________   FINAL CLINICAL IMPRESSION(S) / ED DIAGNOSES  Final diagnoses:  Acute maxillary sinusitis, recurrence  not specified  Cough     ED Discharge Orders         Ordered    doxycycline (VIBRAMYCIN) 100 MG capsule  2 times daily     07/31/18 1114    chlorpheniramine-HYDROcodone (TUSSIONEX PENNKINETIC ER) 10-8 MG/5ML SUER  At bedtime PRN     07/31/18 1114           Note: This dictation was prepared with Dragon dictation along with smaller phrase technology. Any transcriptional errors that result from this process are unintentional.         Marylene Land, NP 07/31/18 1140

## 2018-07-31 NOTE — Discharge Instructions (Addendum)
Take medication as prescribed. Rest. Drink plenty of fluids.  ° °Follow up with your primary care physician this week as needed. Return to Urgent care for new or worsening concerns.  ° °

## 2018-08-06 DIAGNOSIS — Z6829 Body mass index (BMI) 29.0-29.9, adult: Secondary | ICD-10-CM | POA: Diagnosis not present

## 2018-08-06 DIAGNOSIS — M2550 Pain in unspecified joint: Secondary | ICD-10-CM | POA: Diagnosis not present

## 2018-08-06 DIAGNOSIS — I2510 Atherosclerotic heart disease of native coronary artery without angina pectoris: Secondary | ICD-10-CM | POA: Diagnosis not present

## 2018-08-06 DIAGNOSIS — Z720 Tobacco use: Secondary | ICD-10-CM | POA: Diagnosis not present

## 2018-09-13 ENCOUNTER — Ambulatory Visit: Payer: BLUE CROSS/BLUE SHIELD | Admitting: Family Medicine

## 2018-09-13 ENCOUNTER — Encounter: Payer: Self-pay | Admitting: Family Medicine

## 2018-09-13 ENCOUNTER — Other Ambulatory Visit: Payer: Self-pay

## 2018-09-13 VITALS — BP 122/70 | HR 72 | Ht 70.0 in | Wt 210.0 lb

## 2018-09-13 DIAGNOSIS — R822 Biliuria: Secondary | ICD-10-CM | POA: Diagnosis not present

## 2018-09-13 DIAGNOSIS — N410 Acute prostatitis: Secondary | ICD-10-CM | POA: Diagnosis not present

## 2018-09-13 LAB — POCT URINALYSIS DIPSTICK
Bilirubin, UA: POSITIVE
Glucose, UA: NEGATIVE
Ketones, UA: NEGATIVE
Nitrite, UA: POSITIVE
Protein, UA: POSITIVE — AB
Spec Grav, UA: 1.02 (ref 1.010–1.025)
Urobilinogen, UA: 2 E.U./dL — AB
pH, UA: 5 (ref 5.0–8.0)

## 2018-09-13 MED ORDER — CEPHALEXIN 500 MG PO CAPS: 500.0000 mg | ORAL_CAPSULE | Freq: Four times a day (QID) | ORAL | 1 refills | Status: DC

## 2018-09-13 NOTE — Progress Notes (Signed)
Date:  09/13/2018   Name:  Darren Page   DOB:  03-Oct-1955   MRN:  [id number]   Chief Complaint: Urinary Tract Infection (had 1 day of pyridium/ Sx- burning and frequency 3-4 times a night )  Urinary Tract Infection   This is a new problem. The current episode started in the past 7 days (Friday). The problem occurs intermittently. The problem has been gradually worsening. The quality of the pain is described as burning. There has been no fever. There is no history of pyelonephritis. Associated symptoms include frequency, hematuria, sweats and urgency. Pertinent negatives include no chills, discharge, flank pain, hesitancy, nausea or vomiting. Associated symptoms comments: nocturia. Treatments tried: pyridium. The treatment provided mild relief.    Review of Systems  Constitutional: Negative.  Negative for chills, fatigue, fever and unexpected weight change.  HENT: Negative for congestion, ear discharge, ear pain, rhinorrhea, sinus pressure, sneezing and sore throat.   Eyes: Negative for photophobia, pain, discharge, redness and itching.  Respiratory: Negative for cough, shortness of breath, wheezing and stridor.   Gastrointestinal: Negative for abdominal pain, blood in stool, constipation, diarrhea, nausea and vomiting.  Endocrine: Negative for cold intolerance, heat intolerance, polydipsia, polyphagia and polyuria.  Genitourinary: Positive for frequency, hematuria and urgency. Negative for dysuria, flank pain and hesitancy.  Musculoskeletal: Negative for arthralgias, back pain and myalgias.  Skin: Negative for rash.  Allergic/Immunologic: Negative for environmental allergies and food allergies.  Neurological: Negative for dizziness, weakness, light-headedness, numbness and headaches.  Hematological: Negative for adenopathy. Does not bruise/bleed easily.  Psychiatric/Behavioral: Negative for dysphoric mood. The patient is not nervous/anxious.     Patient Active Problem List    Diagnosis Date Noted  . Coronary artery disease 11/22/2012  . Hypercholesterolemia 11/22/2012  . Tobacco use disorder 11/22/2012    Allergies  Allergen Reactions  . Atorvastatin Rash    myalgias    Past Surgical History:  Procedure Laterality Date  . SKIN CANCER EXCISION     mohs nose    Social History   Tobacco Use  . Smoking status: Former Smoker    Types: Cigarettes  . Smokeless tobacco: Never Used  . Tobacco comment: patient on nicoderm patch  Substance Use Topics  . Alcohol use: No    Frequency: Never  . Drug use: No     Medication list has been reviewed and updated.  Current Meds  Medication Sig  . acetaminophen (TYLENOL) 500 MG tablet Take 1,000 mg by mouth.  Marland Kitchen aspirin 81 MG chewable tablet Chew 81 mg by mouth.  . metoprolol succinate (TOPROL-XL) 25 MG 24 hr tablet Take 25 mg by mouth.  . nitroGLYCERIN (NITROSTAT) 0.4 MG SL tablet Place 0.4 mg under the tongue.  . Omega-3 1000 MG CAPS Take 1 capsule by mouth daily.  . rosuvastatin (CRESTOR) 5 MG tablet Take 5 mg by mouth daily. cardio  . [DISCONTINUED] nicotine polacrilex (NICORETTE) 4 MG gum Take 1 each (4 mg total) by mouth as needed for smoking cessation.    PHQ 2/9 Scores 01/15/2017  PHQ - 2 Score 0  PHQ- 9 Score 0    BP Readings from Last 3 Encounters:  09/13/18 122/70  07/31/18 127/83  07/17/17 132/80    Physical Exam Vitals signs and nursing note reviewed.  HENT:     Head: Normocephalic.     Right Ear: Tympanic membrane, ear canal and external ear normal.     Left Ear: Tympanic membrane, ear canal and external ear normal.  Nose: Nose normal. No congestion or rhinorrhea.  Eyes:     General: No scleral icterus.       Right eye: No discharge.        Left eye: No discharge.     Conjunctiva/sclera: Conjunctivae normal.     Pupils: Pupils are equal, round, and reactive to light.  Neck:     Musculoskeletal: Normal range of motion and neck supple.     Thyroid: No thyromegaly.      Vascular: No JVD.     Trachea: No tracheal deviation.  Cardiovascular:     Rate and Rhythm: Normal rate and regular rhythm.     Heart sounds: Normal heart sounds. No murmur. No friction rub. No gallop.   Pulmonary:     Effort: No respiratory distress.     Breath sounds: Normal breath sounds. No wheezing or rales.  Abdominal:     General: Bowel sounds are normal.     Palpations: Abdomen is soft. There is no mass.     Tenderness: There is no abdominal tenderness. There is no guarding or rebound.  Musculoskeletal: Normal range of motion.        General: No tenderness.  Lymphadenopathy:     Cervical: No cervical adenopathy.  Skin:    General: Skin is warm.     Coloration: Skin is not jaundiced or pale.     Findings: No bruising, erythema, lesion or rash.  Neurological:     Mental Status: She is alert and oriented to person, place, and time.     Cranial Nerves: No cranial nerve deficit.     Deep Tendon Reflexes: Reflexes are normal and symmetric.     Wt Readings from Last 3 Encounters:  09/13/18 210 lb (95.3 kg)  07/31/18 205 lb (93 kg)  07/17/17 210 lb (95.3 kg)    BP 122/70   Pulse 72   Ht 5\' 10"  (1.778 m)   Wt 210 lb (95.3 kg)   BMI 30.13 kg/m   Assessment and Plan: 1. Acute prostatitis Acute.  New onset.  Exam and history is consistent with prostatitis/urinary tract infection.  Dipstick is consistent with this as well.  Will initiate cephalexin 500 mg 4 times a day for at least 7 days. - cephALEXin (KEFLEX) 500 MG capsule; Take 1 capsule (500 mg total) by mouth 4 (four) times daily.  Dispense: 40 capsule; Refill: 1 - POCT urinalysis dipstick  2. Bilirubin in urine In the meantime we noticed that there is bilirubin in the urine and patient has been restarted on a statin Crestor supposedly 5 mg with every other day but patient is taking it daily.  Will check a hepatic function panel this is to rule out hepatotoxicity due to the statin - Hepatic Function Panel (6) - POCT  urinalysis dipstick

## 2018-09-14 LAB — HEPATIC FUNCTION PANEL (6)
ALT: 16 IU/L (ref 0–44)
AST: 17 IU/L (ref 0–40)
Albumin: 4 g/dL (ref 3.8–4.8)
Alkaline Phosphatase: 84 IU/L (ref 39–117)
Bilirubin Total: 0.6 mg/dL (ref 0.0–1.2)
Bilirubin, Direct: 0.23 mg/dL (ref 0.00–0.40)

## 2018-09-17 ENCOUNTER — Other Ambulatory Visit: Payer: Self-pay

## 2018-09-17 ENCOUNTER — Other Ambulatory Visit (INDEPENDENT_AMBULATORY_CARE_PROVIDER_SITE_OTHER): Payer: BLUE CROSS/BLUE SHIELD

## 2018-09-17 DIAGNOSIS — R310 Gross hematuria: Secondary | ICD-10-CM

## 2018-09-17 LAB — POCT URINALYSIS DIPSTICK
Bilirubin, UA: NEGATIVE
Glucose, UA: NEGATIVE
Ketones, UA: NEGATIVE
Leukocytes, UA: NEGATIVE
Nitrite, UA: NEGATIVE
Protein, UA: NEGATIVE
Spec Grav, UA: 1.01 (ref 1.010–1.025)
Urobilinogen, UA: 0.2 E.U./dL
pH, UA: 6 (ref 5.0–8.0)

## 2018-09-17 NOTE — Progress Notes (Unsigned)
Put in referral for urology

## 2018-09-19 LAB — URINE CULTURE: Organism ID, Bacteria: NO GROWTH

## 2018-10-08 ENCOUNTER — Other Ambulatory Visit (INDEPENDENT_AMBULATORY_CARE_PROVIDER_SITE_OTHER): Payer: BLUE CROSS/BLUE SHIELD

## 2018-10-08 DIAGNOSIS — R310 Gross hematuria: Secondary | ICD-10-CM | POA: Diagnosis not present

## 2018-10-08 LAB — POCT URINALYSIS DIPSTICK
Bilirubin, UA: NEGATIVE
Blood, UA: NEGATIVE
Glucose, UA: NEGATIVE
Ketones, UA: NEGATIVE
Leukocytes, UA: NEGATIVE
Nitrite, UA: NEGATIVE
Protein, UA: NEGATIVE
Spec Grav, UA: 1.01 (ref 1.010–1.025)
Urobilinogen, UA: 0.2 E.U./dL
pH, UA: 5 (ref 5.0–8.0)

## 2018-10-08 NOTE — Progress Notes (Signed)
Checked urine

## 2019-01-31 DIAGNOSIS — E785 Hyperlipidemia, unspecified: Secondary | ICD-10-CM | POA: Diagnosis not present

## 2019-01-31 DIAGNOSIS — I2510 Atherosclerotic heart disease of native coronary artery without angina pectoris: Secondary | ICD-10-CM | POA: Diagnosis not present

## 2019-01-31 DIAGNOSIS — Z720 Tobacco use: Secondary | ICD-10-CM | POA: Diagnosis not present

## 2019-03-23 DIAGNOSIS — D225 Melanocytic nevi of trunk: Secondary | ICD-10-CM | POA: Diagnosis not present

## 2019-03-23 DIAGNOSIS — D2230 Melanocytic nevi of unspecified part of face: Secondary | ICD-10-CM | POA: Diagnosis not present

## 2019-03-23 DIAGNOSIS — D229 Melanocytic nevi, unspecified: Secondary | ICD-10-CM | POA: Diagnosis not present

## 2019-03-23 DIAGNOSIS — Z1283 Encounter for screening for malignant neoplasm of skin: Secondary | ICD-10-CM | POA: Diagnosis not present

## 2019-03-23 DIAGNOSIS — L820 Inflamed seborrheic keratosis: Secondary | ICD-10-CM | POA: Diagnosis not present

## 2019-05-09 ENCOUNTER — Telehealth: Payer: Self-pay | Admitting: Family Medicine

## 2019-05-09 NOTE — Telephone Encounter (Signed)
Please call Darren Page and tell him we don't send those out to the home address. He will need to be seen and then Dr. Ronnald Ramp can order the test if she chooses to do so.

## 2019-05-09 NOTE — Telephone Encounter (Signed)
Patient requesting cologuard test sent to address.

## 2019-07-12 ENCOUNTER — Other Ambulatory Visit: Payer: Self-pay

## 2019-07-12 ENCOUNTER — Ambulatory Visit: Payer: BLUE CROSS/BLUE SHIELD | Admitting: Family Medicine

## 2019-07-12 ENCOUNTER — Encounter: Payer: Self-pay | Admitting: Family Medicine

## 2019-07-12 VITALS — BP 130/60 | HR 72 | Ht 70.0 in | Wt 189.0 lb

## 2019-07-12 DIAGNOSIS — R3989 Other symptoms and signs involving the genitourinary system: Secondary | ICD-10-CM | POA: Diagnosis not present

## 2019-07-12 DIAGNOSIS — F17210 Nicotine dependence, cigarettes, uncomplicated: Secondary | ICD-10-CM | POA: Diagnosis not present

## 2019-07-12 DIAGNOSIS — Z1211 Encounter for screening for malignant neoplasm of colon: Secondary | ICD-10-CM | POA: Diagnosis not present

## 2019-07-12 MED ORDER — NICOTINE 14 MG/24HR TD PT24: 14.0000 mg | MEDICATED_PATCH | Freq: Every day | TRANSDERMAL | 2 refills | Status: DC

## 2019-07-12 NOTE — Progress Notes (Signed)
Date:  07/12/2019   Name:  Darren Page   DOB:  05-05-1956   MRN:  [id number]   Chief Complaint: Nicotine Dependence (wants to start on 14mg  of patches)  Nicotine Dependence Presents for follow-up visit. Symptoms are negative for fatigue and sore throat. Her urge triggers include company of smokers. Symptom course: backslide. Her first smoke is from 8 to 10 AM. She smokes < 1/2 a pack of cigarettes per day. Compliance with prior treatments has been good.    No results found for: CREATININE, BUN, NA, K, CL, CO2 No results found for: CHOL, HDL, LDLCALC, LDLDIRECT, TRIG, CHOLHDL No results found for: TSH No results found for: HGBA1C   Review of Systems  Constitutional: Negative.  Negative for chills, fatigue, fever and unexpected weight change.  HENT: Negative for congestion, ear discharge, ear pain, nosebleeds, rhinorrhea, sinus pressure, sneezing and sore throat.   Eyes: Negative for photophobia, pain, discharge, redness and itching.  Respiratory: Negative for cough, chest tightness, shortness of breath, wheezing and stridor.   Cardiovascular: Negative for chest pain, palpitations and leg swelling.  Gastrointestinal: Negative for abdominal pain, blood in stool, constipation, diarrhea, nausea and vomiting.  Endocrine: Negative for cold intolerance, heat intolerance, polydipsia, polyphagia and polyuria.  Genitourinary: Negative for dysuria, flank pain, frequency, hematuria and urgency.  Musculoskeletal: Negative for arthralgias, back pain and myalgias.  Skin: Negative for rash.  Allergic/Immunologic: Negative for environmental allergies and food allergies.  Neurological: Negative for dizziness, weakness, light-headedness, numbness and headaches.  Hematological: Negative for adenopathy. Does not bruise/bleed easily.  Psychiatric/Behavioral: Negative for dysphoric mood. The patient is not nervous/anxious.     Patient Active Problem List   Diagnosis Date Noted  . Coronary  artery disease 11/22/2012  . Hypercholesterolemia 11/22/2012  . Tobacco use disorder 11/22/2012    Allergies  Allergen Reactions  . Atorvastatin Rash    myalgias    Past Surgical History:  Procedure Laterality Date  . SKIN CANCER EXCISION     mohs nose    Social History   Tobacco Use  . Smoking status: Current Every Day Smoker    Types: Cigarettes  . Smokeless tobacco: Never Used  . Tobacco comment: start back on patches  Substance Use Topics  . Alcohol use: No  . Drug use: No     Medication list has been reviewed and updated.  Current Meds  Medication Sig  . acetaminophen (TYLENOL) 500 MG tablet Take 1,000 mg by mouth.  Marland Kitchen aspirin 81 MG chewable tablet Chew 81 mg by mouth.  . metoprolol succinate (TOPROL-XL) 25 MG 24 hr tablet Take 25 mg by mouth.  . nitroGLYCERIN (NITROSTAT) 0.4 MG SL tablet Place 0.4 mg under the tongue.  . Omega-3 1000 MG CAPS Take 1 capsule by mouth daily.  . rosuvastatin (CRESTOR) 5 MG tablet Take 5 mg by mouth daily. cardio    PHQ 2/9 Scores 07/12/2019 01/15/2017  PHQ - 2 Score 0 0  PHQ- 9 Score 1 0    BP Readings from Last 3 Encounters:  07/12/19 130/60  09/13/18 122/70  07/31/18 127/83    Physical Exam Vitals and nursing note reviewed.  HENT:     Head: Normocephalic.     Right Ear: Tympanic membrane, ear canal and external ear normal. There is no impacted cerumen.     Left Ear: Tympanic membrane, ear canal and external ear normal. There is no impacted cerumen.     Nose: Nose normal. No congestion or rhinorrhea.  Mouth/Throat:     Mouth: Mucous membranes are moist.  Eyes:     General: No scleral icterus.       Right eye: No discharge.        Left eye: No discharge.     Conjunctiva/sclera: Conjunctivae normal.     Pupils: Pupils are equal, round, and reactive to light.  Neck:     Thyroid: No thyromegaly.     Vascular: No JVD.     Trachea: No tracheal deviation.  Cardiovascular:     Rate and Rhythm: Normal rate and regular  rhythm.     Heart sounds: Normal heart sounds. No murmur. No friction rub. No gallop.   Pulmonary:     Effort: Pulmonary effort is normal. No respiratory distress.     Breath sounds: Normal breath sounds. No wheezing, rhonchi or rales.  Abdominal:     General: Bowel sounds are normal.     Palpations: Abdomen is soft. There is no mass.     Tenderness: There is no abdominal tenderness. There is no guarding or rebound.  Genitourinary:    Prostate: Enlarged. Not tender and no nodules present.     Rectum: Guaiac result negative. No mass or tenderness.     Comments: Firm consistency Musculoskeletal:        General: No tenderness. Normal range of motion.     Cervical back: Normal range of motion and neck supple.  Lymphadenopathy:     Cervical: No cervical adenopathy.  Skin:    General: Skin is warm.     Findings: No rash.  Neurological:     Mental Status: She is alert and oriented to person, place, and time.     Cranial Nerves: No cranial nerve deficit.     Deep Tendon Reflexes: Reflexes are normal and symmetric.     Wt Readings from Last 3 Encounters:  07/12/19 189 lb (85.7 kg)  09/13/18 210 lb (95.3 kg)  07/31/18 205 lb (93 kg)    BP 130/60   Pulse 72   Ht 5\' 10"  (1.778 m)   Wt 189 lb (85.7 kg)   BMI 27.12 kg/m   Assessment and Plan:  1. Cigarette nicotine dependence without complication 2 impending stress with the Covid and family and work load patient has had resumption of nicotine dependency.  Patient is currently on less than half pack a day and we will initiate at 14 mg NicoDerm CQ patches.  It has been written that if patient would like to progress to 7 at his own pace is been okay.  2. Abnormal prostate exam Patient was asking about colorectal screening.  Patient agreed to a DRE which noticed that the prostate was enlarged and firm I did not appreciate any nodularity but was concerned about the nature of the prostate.  Patient agreed to see a urologist for evaluation.   In the meantime we will obtain a PSA - Ambulatory referral to Urology - PSA  3. Colon cancer screening Discussed with patient and patient agreed to a fecal occult test by immunochemical means.  Guaiac was noted to be negative and so we will further evaluate with this and then discussed in the future whether or not we do colonoscopy versus Cologuard. - Fecal occult blood, imunochemical

## 2019-07-13 LAB — PSA: Prostate Specific Ag, Serum: 2.3 ng/mL (ref 0.0–4.0)

## 2019-07-20 LAB — FECAL OCCULT BLOOD, IMMUNOCHEMICAL

## 2019-07-25 ENCOUNTER — Other Ambulatory Visit: Payer: BLUE CROSS/BLUE SHIELD

## 2019-07-25 ENCOUNTER — Other Ambulatory Visit: Payer: Self-pay

## 2019-07-25 DIAGNOSIS — Z1211 Encounter for screening for malignant neoplasm of colon: Secondary | ICD-10-CM

## 2019-07-25 NOTE — Progress Notes (Signed)
Printed requisition for FIT test

## 2019-07-28 LAB — FECAL OCCULT BLOOD, IMMUNOCHEMICAL: Fecal Occult Bld: POSITIVE — AB

## 2019-07-28 LAB — SPECIMEN STATUS REPORT

## 2019-08-01 ENCOUNTER — Other Ambulatory Visit: Payer: Self-pay

## 2019-08-01 ENCOUNTER — Telehealth: Payer: Self-pay

## 2019-08-01 DIAGNOSIS — R195 Other fecal abnormalities: Secondary | ICD-10-CM

## 2019-08-01 MED ORDER — NA SULFATE-K SULFATE-MG SULF 17.5-3.13-1.6 GM/177ML PO SOLN: 1.0000 | Freq: Once | ORAL | 0 refills | Status: AC

## 2019-08-01 NOTE — Progress Notes (Unsigned)
Ref placed for GI 

## 2019-08-01 NOTE — Telephone Encounter (Signed)
Gastroenterology Pre-Procedure Review  Request Date: August 23, 2019 Requesting Physician: Dr. Allen Norris  PATIENT REVIEW QUESTIONS: The patient responded to the following health history questions as indicated:    1. Are you having any GI issues? no 2. Do you have a personal history of Polyps? no 3. Do you have a family history of Colon Cancer or Polyps? no 4. Diabetes Mellitus? no 5. Joint replacements in the past 12 months?no 6. Major health problems in the past 3 months?no 7. Any artificial heart valves, MVP, or defibrillator?no    MEDICATIONS & ALLERGIES:    Patient reports the following regarding taking any anticoagulation/antiplatelet therapy:   Plavix, Coumadin, Eliquis, Xarelto, Lovenox, Pradaxa, Brilinta, or Effient? no Aspirin? no  Patient confirms/reports the following medications:  Current Outpatient Medications  Medication Sig Dispense Refill  . acetaminophen (TYLENOL) 500 MG tablet Take 1,000 mg by mouth.    Marland Kitchen aspirin 81 MG chewable tablet Chew 81 mg by mouth.    . metoprolol succinate (TOPROL-XL) 25 MG 24 hr tablet Take 25 mg by mouth.    . nicotine (NICODERM CQ - DOSED IN MG/24 HOURS) 14 mg/24hr patch Place 1 patch (14 mg total) onto the skin daily. May progress to 7 mg patch as desired 28 patch 2  . nitroGLYCERIN (NITROSTAT) 0.4 MG SL tablet Place 0.4 mg under the tongue.    . Omega-3 1000 MG CAPS Take 1 capsule by mouth daily.    . rosuvastatin (CRESTOR) 5 MG tablet Take 5 mg by mouth daily. cardio     No current facility-administered medications for this visit.    Patient confirms/reports the following allergies:  Allergies  Allergen Reactions  . Atorvastatin Rash    myalgias    No orders of the defined types were placed in this encounter.   AUTHORIZATION INFORMATION Primary Insurance: 1D#: Group #:  Secondary Insurance: 1D#: Group #:  SCHEDULE INFORMATION: Date: Tuesday 08/23/19 Time: Location:[facility]

## 2019-08-02 DIAGNOSIS — Z720 Tobacco use: Secondary | ICD-10-CM | POA: Diagnosis not present

## 2019-08-02 DIAGNOSIS — Z6827 Body mass index (BMI) 27.0-27.9, adult: Secondary | ICD-10-CM | POA: Diagnosis not present

## 2019-08-02 DIAGNOSIS — I2510 Atherosclerotic heart disease of native coronary artery without angina pectoris: Secondary | ICD-10-CM | POA: Diagnosis not present

## 2019-08-09 ENCOUNTER — Ambulatory Visit: Payer: BLUE CROSS/BLUE SHIELD | Admitting: Urology

## 2019-08-12 ENCOUNTER — Telehealth: Payer: Self-pay

## 2019-08-12 NOTE — Telephone Encounter (Signed)
Returned patients wife's call in regards to having COVID test in [place], and having results back in time for Colonoscopy.  Patients wife has been informed that we are required to have patients go to [place] for Pre-procedural COVID test so that it is resulted directly to [place] in a timely manner.  She has also been informed that we have a testing schedule to go by based on procedure day and we should have results back in time for patients Colonoscopy on Tuesday 08-23-19.  Thanks,  [place], [place]

## 2019-08-19 ENCOUNTER — Other Ambulatory Visit
Admission: RE | Admit: 2019-08-19 | Discharge: 2019-08-19 | Disposition: A | Discharge status: Home / Self Care | Payer: BLUE CROSS/BLUE SHIELD | Source: Ambulatory Visit | Attending: Gastroenterology | Admitting: Gastroenterology

## 2019-08-19 ENCOUNTER — Other Ambulatory Visit: Payer: Self-pay

## 2019-08-19 DIAGNOSIS — Z20822 Contact with and (suspected) exposure to covid-19: Secondary | ICD-10-CM | POA: Diagnosis not present

## 2019-08-19 DIAGNOSIS — Z01812 Encounter for preprocedural laboratory examination: Secondary | ICD-10-CM | POA: Diagnosis not present

## 2019-08-19 LAB — SARS CORONAVIRUS 2 (TAT 6-24 HRS): SARS Coronavirus 2: NEGATIVE

## 2019-08-23 ENCOUNTER — Encounter: Payer: Self-pay | Admitting: Gastroenterology

## 2019-08-23 ENCOUNTER — Encounter
Admission: RE | Disposition: A | Discharge status: Home / Self Care | Payer: Self-pay | Source: Home / Self Care | Attending: Gastroenterology

## 2019-08-23 ENCOUNTER — Ambulatory Visit: Payer: BLUE CROSS/BLUE SHIELD | Admitting: Anesthesiology

## 2019-08-23 ENCOUNTER — Other Ambulatory Visit: Payer: Self-pay

## 2019-08-23 ENCOUNTER — Ambulatory Visit
Admission: RE | Admit: 2019-08-23 | Discharge: 2019-08-23 | Disposition: A | Discharge status: Home / Self Care | Payer: BLUE CROSS/BLUE SHIELD | Attending: Gastroenterology | Admitting: Gastroenterology

## 2019-08-23 DIAGNOSIS — Z85828 Personal history of other malignant neoplasm of skin: Secondary | ICD-10-CM | POA: Insufficient documentation

## 2019-08-23 DIAGNOSIS — D122 Benign neoplasm of ascending colon: Secondary | ICD-10-CM | POA: Insufficient documentation

## 2019-08-23 DIAGNOSIS — D123 Benign neoplasm of transverse colon: Secondary | ICD-10-CM | POA: Insufficient documentation

## 2019-08-23 DIAGNOSIS — R195 Other fecal abnormalities: Secondary | ICD-10-CM | POA: Diagnosis not present

## 2019-08-23 DIAGNOSIS — Z87891 Personal history of nicotine dependence: Secondary | ICD-10-CM | POA: Diagnosis not present

## 2019-08-23 DIAGNOSIS — K5730 Diverticulosis of large intestine without perforation or abscess without bleeding: Secondary | ICD-10-CM | POA: Insufficient documentation

## 2019-08-23 DIAGNOSIS — I10 Essential (primary) hypertension: Secondary | ICD-10-CM | POA: Insufficient documentation

## 2019-08-23 DIAGNOSIS — Z7982 Long term (current) use of aspirin: Secondary | ICD-10-CM | POA: Diagnosis not present

## 2019-08-23 DIAGNOSIS — K621 Rectal polyp: Secondary | ICD-10-CM | POA: Diagnosis not present

## 2019-08-23 DIAGNOSIS — Z79899 Other long term (current) drug therapy: Secondary | ICD-10-CM | POA: Diagnosis not present

## 2019-08-23 DIAGNOSIS — J449 Chronic obstructive pulmonary disease, unspecified: Secondary | ICD-10-CM | POA: Insufficient documentation

## 2019-08-23 DIAGNOSIS — E785 Hyperlipidemia, unspecified: Secondary | ICD-10-CM | POA: Insufficient documentation

## 2019-08-23 DIAGNOSIS — D125 Benign neoplasm of sigmoid colon: Secondary | ICD-10-CM | POA: Insufficient documentation

## 2019-08-23 DIAGNOSIS — D128 Benign neoplasm of rectum: Secondary | ICD-10-CM | POA: Diagnosis not present

## 2019-08-23 DIAGNOSIS — K641 Second degree hemorrhoids: Secondary | ICD-10-CM | POA: Diagnosis not present

## 2019-08-23 DIAGNOSIS — K635 Polyp of colon: Secondary | ICD-10-CM

## 2019-08-23 DIAGNOSIS — I2510 Atherosclerotic heart disease of native coronary artery without angina pectoris: Secondary | ICD-10-CM | POA: Diagnosis not present

## 2019-08-23 HISTORY — PX: COLONOSCOPY WITH PROPOFOL: SHX5780

## 2019-08-23 SURGERY — COLONOSCOPY WITH PROPOFOL
Anesthesia: General

## 2019-08-23 MED ORDER — EPHEDRINE SULFATE 50 MG/ML IJ SOLN
INTRAMUSCULAR | Status: DC | PRN
  Administered 2019-08-23 (×3): 5 mg via INTRAVENOUS
  Administered 2019-08-23: 10 mg via INTRAVENOUS

## 2019-08-23 MED ORDER — PHENYLEPHRINE HCL (PRESSORS) 10 MG/ML IV SOLN
INTRAVENOUS | Status: AC
  Filled 2019-08-23: qty 1

## 2019-08-23 MED ORDER — FENTANYL CITRATE (PF) 100 MCG/2ML IJ SOLN
INTRAMUSCULAR | Status: DC | PRN
  Administered 2019-08-23: 50 ug via INTRAVENOUS

## 2019-08-23 MED ORDER — MIDAZOLAM HCL 2 MG/2ML IJ SOLN
INTRAMUSCULAR | Status: DC | PRN
  Administered 2019-08-23: 2 mg via INTRAVENOUS

## 2019-08-23 MED ORDER — PROPOFOL 500 MG/50ML IV EMUL
INTRAVENOUS | Status: AC
  Filled 2019-08-23: qty 50

## 2019-08-23 MED ORDER — SODIUM CHLORIDE 0.9 % IV SOLN: INTRAVENOUS | Status: DC

## 2019-08-23 MED ORDER — FENTANYL CITRATE (PF) 100 MCG/2ML IJ SOLN
INTRAMUSCULAR | Status: AC
  Filled 2019-08-23: qty 2

## 2019-08-23 MED ORDER — PROPOFOL 500 MG/50ML IV EMUL
INTRAVENOUS | Status: DC | PRN
  Administered 2019-08-23: 120 ug/kg/min via INTRAVENOUS

## 2019-08-23 MED ORDER — PHENYLEPHRINE HCL (PRESSORS) 10 MG/ML IV SOLN
INTRAVENOUS | Status: DC | PRN
  Administered 2019-08-23 (×4): 50 ug via INTRAVENOUS

## 2019-08-23 MED ORDER — EPHEDRINE 5 MG/ML INJ
INTRAVENOUS | Status: AC
  Filled 2019-08-23: qty 10

## 2019-08-23 MED ORDER — MIDAZOLAM HCL 2 MG/2ML IJ SOLN
INTRAMUSCULAR | Status: AC
  Filled 2019-08-23: qty 2

## 2019-08-23 NOTE — H&P (Signed)
Lucilla Lame, MD [address] [place], [place] [address] Phone:[phone] Fax : [phone]  Primary Care Physician:  Juline Patch, MD Primary Gastroenterologist:  Dr. Allen Norris  Pre-Procedure History & Physical: HPI:  Darren Page is a 64 y.o. adult is here for an colonoscopy.   Past Medical History:  Diagnosis Date  . COPD (chronic obstructive pulmonary disease) ([place])   . History of sebaceous cyst 07/19/2017   Left shoulder  . Hyperlipidemia   . Hypertension     Past Surgical History:  Procedure Laterality Date  . SKIN CANCER EXCISION     mohs nose    Prior to Admission medications   Medication Sig Start Date End Date Taking? Authorizing Provider  metoprolol succinate (TOPROL-XL) 25 MG 24 hr tablet Take 25 mg by mouth. 02/08/15  Yes [provider]  acetaminophen (TYLENOL) 500 MG tablet Take 1,000 mg by mouth.    [provider]  aspirin 81 MG chewable tablet Chew 81 mg by mouth.    [provider]  nicotine (NICODERM CQ - DOSED IN MG/24 HOURS) 14 mg/24hr patch Place 1 patch (14 mg total) onto the skin daily. May progress to 7 mg patch as desired 07/12/19   Juline Patch, MD  nitroGLYCERIN (NITROSTAT) 0.4 MG SL tablet Place 0.4 mg under the tongue. 02/08/15   [provider]  Omega-3 1000 MG CAPS Take 1 capsule by mouth daily. 11/29/12   [provider]  rosuvastatin (CRESTOR) 5 MG tablet Take 5 mg by mouth daily. cardio 07/19/18   [provider]    Allergies as of 08/02/2019 - Review Complete 07/12/2019  Allergen Reaction Noted  . Atorvastatin Rash 11/22/2012    Family History  Problem Relation Age of Onset  . Cancer Mother   . Healthy Father     Social History   Socioeconomic History  . Marital status: Married    Spouse name: Not on file  . Number of children: Not on file  . Years of education: Not on file  . Highest education level: Not on file  Occupational History  . Not on  file  Tobacco Use  . Smoking status: Former Smoker    Types: Cigarettes  . Smokeless tobacco: Never Used  . Tobacco comment: start back on patches  Substance and Sexual Activity  . Alcohol use: No  . Drug use: No  . Sexual activity: Not Currently  Other Topics Concern  . Not on file  Social History Narrative  . Not on file   Social Determinants of Health   Financial Resource Strain:   . Difficulty of Paying Living Expenses:   Food Insecurity:   . Worried About Charity fundraiser in the Last Year:   . Arboriculturist in the Last Year:   Transportation Needs:   . Film/video editor (Medical):   Marland Kitchen Lack of Transportation (Non-Medical):   Physical Activity:   . Days of Exercise per Week:   . Minutes of Exercise per Session:   Stress:   . Feeling of Stress :   Social Connections:   . Frequency of Communication with Friends and Family:   . Frequency of Social Gatherings with Friends and Family:   . Attends Religious Services:   . Active Member of Clubs or Organizations:   . Attends Archivist Meetings:   Marland Kitchen Marital Status:   Intimate Partner Violence:   . Fear of Current or Ex-Partner:   . Emotionally  Abused:   Marland Kitchen Physically Abused:   . Sexually Abused:     Review of Systems: See HPI, otherwise negative ROS  Physical Exam: BP 105/75   Pulse 78   Temp (!) 97.4 F (36.3 C) (Temporal)   Resp (!) 78   Ht 5\' 11"  (1.803 m)   Wt 80.7 kg   SpO2 99%   BMI 24.83 kg/m  General:   Alert,  pleasant and cooperative in NAD Head:  Normocephalic and atraumatic. Neck:  Supple; no masses or thyromegaly. Lungs:  Clear throughout to auscultation.    Heart:  Regular rate and rhythm. Abdomen:  Soft, nontender and nondistended. Normal bowel sounds, without guarding, and without rebound.   Neurologic:  Alert and  oriented x4;  grossly normal neurologically.  Impression/Plan: Darren Page is here for an colonoscopy to be performed for positive FIT  test  Risks, benefits, limitations, and alternatives regarding  colonoscopy have been reviewed with the patient.  Questions have been answered.  All parties agreeable.   Lucilla Lame, MD  08/23/2019, 10:58 AM

## 2019-08-23 NOTE — Op Note (Addendum)
[facility] Gastroenterology Patient Name: Darren Page Procedure Date: 08/23/2019 11:22 AM MRN: [id number] Account #: [id number] Date of Birth: 1955-08-15 Admit Type: Outpatient Age: 64 Room: [facility] ENDO ROOM 4 Gender: Male Note Status: Finalized Procedure:             Colonoscopy Indications:           Positive fecal immunochemical test Providers:             Lucilla Lame MD, MD Medicines:             Propofol per Anesthesia Complications:         No immediate complications. Procedure:             Pre-Anesthesia Assessment:                        - Prior to the procedure, a History and Physical was                         performed, and patient medications and allergies were                         reviewed. The patient's tolerance of previous                         anesthesia was also reviewed. The risks and benefits                         of the procedure and the sedation options and risks                         were discussed with the patient. All questions were                         answered, and informed consent was obtained. Prior                         Anticoagulants: The patient has taken no previous                         anticoagulant or antiplatelet agents. ASA Grade                         Assessment: II - A patient with mild systemic disease.                         After reviewing the risks and benefits, the patient                         was deemed in satisfactory condition to undergo the                         procedure.                        After obtaining informed consent, the colonoscope was                         passed under direct vision. Throughout the procedure,  the patient's blood pressure, pulse, and oxygen                         saturations were monitored continuously. The                         Colonoscope was introduced through the anus and                         advanced to the the cecum,  identified by appendiceal                         orifice and ileocecal valve. The colonoscopy was                         performed without difficulty. The patient tolerated                         the procedure well. The quality of the bowel                         preparation was good. Findings:      The perianal and digital rectal examinations were normal.      A 7 mm polyp was found in the ascending colon. The polyp was sessile.       The polyp was removed with a hot snare. Resection and retrieval were       complete.      Two sessile polyps were found in the ascending colon. The polyps were 4       to 6 mm in size. These polyps were removed with a cold snare. Resection       and retrieval were complete.      A 5 mm polyp was found in the transverse colon. The polyp was sessile.       The polyp was removed with a cold snare. Resection and retrieval were       complete.      Three sessile polyps were found in the sigmoid colon. The polyps were 5       to 7 mm in size. These polyps were removed with a cold snare. Resection       and retrieval were complete.      Two sessile polyps were found in the sigmoid colon. The polyps were 7 to       8 mm in size. These polyps were removed with a hot snare. Resection and       retrieval were complete. To prevent bleeding post-intervention, one       hemostatic clip was successfully placed (MR conditional). There was no       bleeding at the end of the procedure.      Two sessile polyps were found in the rectum. The polyps were 5 to 6 mm       in size. These polyps were removed with a cold snare. Resection and       retrieval were complete.      Multiple small-mouthed diverticula were found in the sigmoid colon.      Non-bleeding internal hemorrhoids were found during retroflexion. The       hemorrhoids were Grade II (internal hemorrhoids that prolapse but reduce       spontaneously). Impression:            -  One 7 mm polyp in the ascending  colon, removed with                         a hot snare. Resected and retrieved.                        - Two 4 to 6 mm polyps in the ascending colon, removed                         with a cold snare. Resected and retrieved.                        - One 5 mm polyp in the transverse colon, removed with                         a cold snare. Resected and retrieved.                        - Three 5 to 7 mm polyps in the sigmoid colon, removed                         with a cold snare. Resected and retrieved.                        - Two 7 to 8 mm polyps in the sigmoid colon, removed                         with a hot snare. Resected and retrieved. Clip (MR                         conditional) was placed.                        - Two 5 to 6 mm polyps in the rectum, removed with a                         cold snare. Resected and retrieved.                        - Diverticulosis in the sigmoid colon.                        - Non-bleeding internal hemorrhoids. Recommendation:        - Discharge patient to home.                        - Resume previous diet.                        - Continue present medications.                        - Await pathology results.                        - Repeat colonoscopy in 1 years if polyp adenoma and  10 years if hyperplastic Procedure Code(s):     --- Professional ---                        [phone], Colonoscopy, flexible; with removal of                         tumor(s), polyp(s), or other lesion(s) by snare                         technique Diagnosis Code(s):     --- Professional ---                        R19.5, Other fecal abnormalities                        K63.5, Polyp of colon CPT copyright 2019 American Medical Association. All rights reserved. The codes documented in this report are preliminary and upon coder review may  be revised to meet current compliance requirements. Lucilla Lame MD, MD 08/23/2019 [id number] PM This report  has been signed electronically. Number of Addenda: 0 Note Initiated On: 08/23/2019 11:22 AM Scope Withdrawal Time: 0 hours 22 minutes 42 seconds  Total Procedure Duration: 0 hours 25 minutes 2 seconds  Estimated Blood Loss:  Estimated blood loss: none.      [facility]

## 2019-08-23 NOTE — Transfer of Care (Signed)
Immediate Anesthesia Transfer of Care Note  Patient: Darren Page  Procedure(s) Performed: COLONOSCOPY WITH PROPOFOL (N/A )  Patient Location: PACU  Anesthesia Type:General  Level of Consciousness: awake and sedated  Airway & Oxygen Therapy: Patient Spontanous Breathing and Patient connected to nasal cannula oxygen  Post-op Assessment: Report given to RN and Post -op Vital signs reviewed and stable  Post vital signs: Reviewed and stable  Last Vitals:  Vitals Value Taken Time  BP    Temp    Pulse    Resp    SpO2      Last Pain:  Vitals:   08/23/19 1040  TempSrc: Temporal  PainSc: 0-No pain         Complications: No apparent anesthesia complications

## 2019-08-24 ENCOUNTER — Encounter: Payer: Self-pay | Admitting: *Deleted

## 2019-08-24 ENCOUNTER — Encounter: Payer: Self-pay | Admitting: Gastroenterology

## 2019-08-24 LAB — SURGICAL PATHOLOGY

## 2019-08-24 NOTE — Anesthesia Preprocedure Evaluation (Addendum)
Anesthesia Evaluation  Patient identified by MRN, date of birth, ID band Patient awake    Reviewed: Allergy & Precautions, H&P , NPO status , Patient's Chart, lab work & pertinent test results, reviewed documented beta blocker date and time   Airway Mallampati: II  TM Distance: >3 FB Neck ROM: full    Dental  (+) Teeth Intact   Pulmonary neg pulmonary ROS, COPD, former smoker,    Pulmonary exam normal        Cardiovascular Exercise Tolerance: Good hypertension, On Medications + CAD  negative cardio ROS Normal cardiovascular exam Rhythm:regular Rate:Normal     Neuro/Psych negative neurological ROS  negative psych ROS   GI/Hepatic negative GI ROS, Neg liver ROS,   Endo/Other  negative endocrine ROS  Renal/GU negative Renal ROS  negative genitourinary   Musculoskeletal   Abdominal   Peds  Hematology negative hematology ROS (+)   Anesthesia Other Findings Past Medical History: No date: COPD (chronic obstructive pulmonary disease) ([place]) 07/19/2017: History of sebaceous cyst     Comment:  Left shoulder No date: Hyperlipidemia No date: Hypertension Past Surgical History: 08/23/2019: COLONOSCOPY WITH PROPOFOL; N/A     Comment:  Procedure: COLONOSCOPY WITH PROPOFOL;  Surgeon: Lucilla Lame, MD;  Location: [facility] ENDOSCOPY;  Service:               Endoscopy;  Laterality: N/A;  priority 3 No date: SKIN CANCER EXCISION     Comment:  mohs nose BMI    Body Mass Index: 24.83 kg/m     Reproductive/Obstetrics negative OB ROS                             Anesthesia Physical Anesthesia Plan  ASA: III  Anesthesia Plan: General   Post-op Pain Management:    Induction:   PONV Risk Score and Plan:   Airway Management Planned:   Additional Equipment:   Intra-op Plan:   Post-operative Plan:   Informed Consent: I have reviewed the patients History and Physical, chart, labs and  discussed the procedure including the risks, benefits and alternatives for the proposed anesthesia with the patient or authorized representative who has indicated his/her understanding and acceptance.     Dental Advisory Given  Plan Discussed with: CRNA  Anesthesia Plan Comments:        Anesthesia Quick Evaluation

## 2019-08-24 NOTE — Anesthesia Postprocedure Evaluation (Signed)
Anesthesia Post Note  Patient: Darren Page  Procedure(s) Performed: COLONOSCOPY WITH PROPOFOL (N/A )  Patient location during evaluation: PACU Anesthesia Type: General Level of consciousness: awake and alert Pain management: pain level controlled Vital Signs Assessment: post-procedure vital signs reviewed and stable Respiratory status: spontaneous breathing, nonlabored ventilation, respiratory function stable and patient connected to nasal cannula oxygen Cardiovascular status: blood pressure returned to baseline and stable Postop Assessment: no apparent nausea or vomiting Anesthetic complications: no     Last Vitals:  Vitals:   08/23/19 1229 08/23/19 1239  BP: 102/64 105/60  Pulse:    Resp:    Temp:    SpO2:      Last Pain:  Vitals:   08/24/19 0730  TempSrc:   PainSc: 0-No pain                 Molli Barrows

## 2019-11-03 ENCOUNTER — Telehealth: Payer: Self-pay | Admitting: Family Medicine

## 2019-11-03 ENCOUNTER — Other Ambulatory Visit: Payer: Self-pay

## 2019-11-03 DIAGNOSIS — F17210 Nicotine dependence, cigarettes, uncomplicated: Secondary | ICD-10-CM

## 2019-11-03 MED ORDER — NICOTINE 7 MG/24HR TD PT24: 7.0000 mg | MEDICATED_PATCH | Freq: Every day | TRANSDERMAL | 1 refills | Status: DC

## 2019-11-03 NOTE — Progress Notes (Unsigned)
Sent in nicoderm patches

## 2019-11-03 NOTE — Telephone Encounter (Signed)
Sent in 7mg  patches to [facility]

## 2019-11-03 NOTE — Telephone Encounter (Signed)
Copied from [place] [id number]. Topic: General - Other >> Nov 03, 2019 10:45 AM Keene Breath wrote: Reason for CRM: Patient called to ask the doctor if it would be alright for him to start the 7 MG of nicotine Nicotine patch since he has finished the 14 MG.  He stated that the doctor wanted the patient to let him know once he was ready to start the 7 MG.  Please advise and if appropriate, patient would like it called into his local pharmacy.  CB# [phone] (M)

## 2020-02-02 DIAGNOSIS — F17210 Nicotine dependence, cigarettes, uncomplicated: Secondary | ICD-10-CM | POA: Diagnosis not present

## 2020-02-02 DIAGNOSIS — E785 Hyperlipidemia, unspecified: Secondary | ICD-10-CM | POA: Diagnosis not present

## 2020-02-02 DIAGNOSIS — Z6826 Body mass index (BMI) 26.0-26.9, adult: Secondary | ICD-10-CM | POA: Diagnosis not present

## 2020-02-02 DIAGNOSIS — I2510 Atherosclerotic heart disease of native coronary artery without angina pectoris: Secondary | ICD-10-CM | POA: Diagnosis not present

## 2020-02-07 DIAGNOSIS — I2510 Atherosclerotic heart disease of native coronary artery without angina pectoris: Secondary | ICD-10-CM | POA: Diagnosis not present

## 2020-02-07 DIAGNOSIS — E785 Hyperlipidemia, unspecified: Secondary | ICD-10-CM | POA: Diagnosis not present

## 2020-02-09 DIAGNOSIS — H903 Sensorineural hearing loss, bilateral: Secondary | ICD-10-CM | POA: Diagnosis not present

## 2020-03-26 ENCOUNTER — Ambulatory Visit (INDEPENDENT_AMBULATORY_CARE_PROVIDER_SITE_OTHER): Payer: BLUE CROSS/BLUE SHIELD | Admitting: Dermatology

## 2020-03-26 ENCOUNTER — Other Ambulatory Visit: Payer: Self-pay

## 2020-03-26 DIAGNOSIS — L821 Other seborrheic keratosis: Secondary | ICD-10-CM

## 2020-03-26 DIAGNOSIS — D229 Melanocytic nevi, unspecified: Secondary | ICD-10-CM

## 2020-03-26 DIAGNOSIS — Z85828 Personal history of other malignant neoplasm of skin: Secondary | ICD-10-CM | POA: Diagnosis not present

## 2020-03-26 DIAGNOSIS — L814 Other melanin hyperpigmentation: Secondary | ICD-10-CM

## 2020-03-26 DIAGNOSIS — L719 Rosacea, unspecified: Secondary | ICD-10-CM

## 2020-03-26 DIAGNOSIS — L578 Other skin changes due to chronic exposure to nonionizing radiation: Secondary | ICD-10-CM

## 2020-03-26 DIAGNOSIS — L720 Epidermal cyst: Secondary | ICD-10-CM

## 2020-03-26 DIAGNOSIS — R238 Other skin changes: Secondary | ICD-10-CM | POA: Diagnosis not present

## 2020-03-26 DIAGNOSIS — D1800 Hemangioma unspecified site: Secondary | ICD-10-CM

## 2020-03-26 DIAGNOSIS — D692 Other nonthrombocytopenic purpura: Secondary | ICD-10-CM

## 2020-03-26 DIAGNOSIS — Z1283 Encounter for screening for malignant neoplasm of skin: Secondary | ICD-10-CM

## 2020-03-26 NOTE — Progress Notes (Signed)
   New Patient Visit  Subjective  Darren Page is a 64 y.o. adult who presents for the following: Annual Exam (History of skin cancer of nose - TBSE today).  The patient presents for Total-Body Skin Exam (TBSE) for skin cancer screening and mole check.  The following portions of the chart were reviewed this encounter and updated as appropriate:  Tobacco  Allergies  Meds  Problems  Med Hx  Surg Hx  Fam Hx     Review of Systems:  No other skin or systemic complaints except as noted in HPI or Assessment and Plan.  Objective  Well appearing patient in no apparent distress; mood and affect are within normal limits.  A full examination was performed including scalp, head, eyes, ears, nose, lips, neck, chest, axillae, abdomen, back, buttocks, bilateral upper extremities, bilateral lower extremities, hands, feet, fingers, toes, fingernails, and toenails. All findings within normal limits unless otherwise noted below.  Objective  Dorsum of Nose: Well healed scar with no evidence of recurrence.   Objective  Face: Pinkness and dilated blood vessels  Objective  Right Ear: Purple papule  Objective  upper back spinal: Subcutaneous nodule of upper back spinal.  0.5 cm cystic papule of right lat neck   Assessment & Plan    Lentigines - Scattered tan macules - Discussed due to sun exposure - Benign, observe - Call for any changes  Seborrheic Keratoses - Stuck-on, waxy, tan-brown papules and plaques  - Discussed benign etiology and prognosis. - Observe - Call for any changes  Melanocytic Nevi - Tan-brown and/or pink-flesh-colored symmetric macules and papules - Benign appearing on exam today - Observation - Call clinic for new or changing moles - Recommend daily use of broad spectrum spf 30+ sunscreen to sun-exposed areas.   Hemangiomas - Red papules - Discussed benign nature - Observe - Call for any changes  Actinic Damage - diffuse scaly erythematous  macules with underlying dyspigmentation - Recommend daily broad spectrum sunscreen SPF 30+ to sun-exposed areas, reapply every 2 hours as needed.  - Call for new or changing lesions.  Skin cancer screening performed today.  Purpura - Violaceous macules and patches - Benign - Related to age, sun damage and/or use of blood thinners - Observe - Can use OTC arnica containing moisturizer such as Dermend Bruise Formula if desired - Call for worsening or other concerns   History of basal cell carcinoma (BCC) Dorsum of Nose  Clear. Observe for recurrence. Call clinic for new or changing lesions.  Recommend regular skin exams, daily broad-spectrum spf 30+ sunscreen use, and photoprotection.     Rosacea Face  Mild. No treatment at this time.  Venous [place] Right Ear  Benign appearing. Observe   Epidermal inclusion cyst upper back spinal  Benign  Skin cancer screening  Return in about 1 year (around 03/26/2021) for TBSE.   I, Ashok Cordia, CMA, am acting as scribe for Sarina Ser, MD .  Documentation: I have reviewed the above documentation for accuracy and completeness, and I agree with the above.  Sarina Ser, MD

## 2020-03-27 ENCOUNTER — Encounter: Payer: Self-pay | Admitting: Dermatology

## 2020-08-06 DIAGNOSIS — F17210 Nicotine dependence, cigarettes, uncomplicated: Secondary | ICD-10-CM | POA: Diagnosis not present

## 2020-08-06 DIAGNOSIS — Z6827 Body mass index (BMI) 27.0-27.9, adult: Secondary | ICD-10-CM | POA: Diagnosis not present

## 2020-08-06 DIAGNOSIS — I2510 Atherosclerotic heart disease of native coronary artery without angina pectoris: Secondary | ICD-10-CM | POA: Diagnosis not present

## 2020-08-06 DIAGNOSIS — E785 Hyperlipidemia, unspecified: Secondary | ICD-10-CM | POA: Diagnosis not present

## 2020-09-05 ENCOUNTER — Other Ambulatory Visit: Payer: Self-pay

## 2020-09-05 ENCOUNTER — Ambulatory Visit (INDEPENDENT_AMBULATORY_CARE_PROVIDER_SITE_OTHER)
Admit: 2020-09-05 | Discharge: 2020-09-05 | Disposition: A | Discharge status: Home / Self Care | Payer: Medicare Other | Attending: Family Medicine | Admitting: Family Medicine

## 2020-09-05 ENCOUNTER — Ambulatory Visit
Admission: EM | Admit: 2020-09-05 | Discharge: 2020-09-05 | Disposition: A | Discharge status: Home / Self Care | Payer: Medicare Other | Attending: Physician Assistant | Admitting: Physician Assistant

## 2020-09-05 ENCOUNTER — Ambulatory Visit: Payer: Self-pay

## 2020-09-05 ENCOUNTER — Encounter: Payer: Self-pay | Admitting: Emergency Medicine

## 2020-09-05 DIAGNOSIS — R109 Unspecified abdominal pain: Secondary | ICD-10-CM

## 2020-09-05 DIAGNOSIS — K8020 Calculus of gallbladder without cholecystitis without obstruction: Secondary | ICD-10-CM | POA: Diagnosis not present

## 2020-09-05 DIAGNOSIS — N132 Hydronephrosis with renal and ureteral calculous obstruction: Secondary | ICD-10-CM

## 2020-09-05 DIAGNOSIS — R112 Nausea with vomiting, unspecified: Secondary | ICD-10-CM | POA: Diagnosis not present

## 2020-09-05 DIAGNOSIS — R1030 Lower abdominal pain, unspecified: Secondary | ICD-10-CM | POA: Insufficient documentation

## 2020-09-05 DIAGNOSIS — N200 Calculus of kidney: Secondary | ICD-10-CM | POA: Diagnosis not present

## 2020-09-05 DIAGNOSIS — K5730 Diverticulosis of large intestine without perforation or abscess without bleeding: Secondary | ICD-10-CM | POA: Diagnosis not present

## 2020-09-05 DIAGNOSIS — R1110 Vomiting, unspecified: Secondary | ICD-10-CM | POA: Diagnosis not present

## 2020-09-05 LAB — CBC WITH DIFFERENTIAL/PLATELET
Abs Immature Granulocytes: 0.03 10*3/uL (ref 0.00–0.07)
Basophils Absolute: 0.1 10*3/uL (ref 0.0–0.1)
Basophils Relative: 1 %
Eosinophils Absolute: 0 10*3/uL (ref 0.0–0.5)
Eosinophils Relative: 0 %
HCT: 52.8 % — ABNORMAL HIGH (ref 39.0–52.0)
Hemoglobin: 17.9 g/dL — ABNORMAL HIGH (ref 13.0–17.0)
Immature Granulocytes: 0 %
Lymphocytes Relative: 11 %
Lymphs Abs: 1 10*3/uL (ref 0.7–4.0)
MCH: 32.1 pg (ref 26.0–34.0)
MCHC: 33.9 g/dL (ref 30.0–36.0)
MCV: 94.8 fL (ref 80.0–100.0)
Monocytes Absolute: 0.5 10*3/uL (ref 0.1–1.0)
Monocytes Relative: 5 %
Neutro Abs: 7.6 10*3/uL (ref 1.7–7.7)
Neutrophils Relative %: 83 %
Platelets: 239 10*3/uL (ref 150–400)
RBC: 5.57 MIL/uL (ref 4.22–5.81)
RDW: 12.9 % (ref 11.5–15.5)
WBC: 9.1 10*3/uL (ref 4.0–10.5)
nRBC: 0 % (ref 0.0–0.2)

## 2020-09-05 LAB — URINALYSIS, COMPLETE (UACMP) WITH MICROSCOPIC
Bacteria, UA: NONE SEEN
Bilirubin Urine: NEGATIVE
Glucose, UA: NEGATIVE mg/dL
Ketones, ur: 40 mg/dL — AB
Leukocytes,Ua: NEGATIVE
Nitrite: NEGATIVE
Protein, ur: 30 mg/dL — AB
Specific Gravity, Urine: >1.03 — ABNORMAL HIGH (ref 1.005–1.030)
WBC, UA: NONE SEEN WBC/hpf (ref 0–5)
pH: 5.5 (ref 5.0–8.0)

## 2020-09-05 LAB — COMPREHENSIVE METABOLIC PANEL
ALT: 23 U/L (ref 0–44)
AST: 29 U/L (ref 15–41)
Albumin: 4.7 g/dL (ref 3.5–5.0)
Alkaline Phosphatase: 68 U/L (ref 38–126)
Anion gap: 7 (ref 5–15)
BUN: 29 mg/dL — ABNORMAL HIGH (ref 8–23)
CO2: 26 mmol/L (ref 22–32)
Calcium: 9.2 mg/dL (ref 8.9–10.3)
Chloride: 104 mmol/L (ref 98–111)
Creatinine, Ser: 1.32 mg/dL — ABNORMAL HIGH (ref 0.61–1.24)
GFR, Estimated: 59 mL/min — ABNORMAL LOW (ref >60)
Glucose, Bld: 125 mg/dL — ABNORMAL HIGH (ref 70–99)
Potassium: 4.5 mmol/L (ref 3.5–5.1)
Sodium: 137 mmol/L (ref 135–145)
Total Bilirubin: 0.5 mg/dL (ref 0.3–1.2)
Total Protein: 8.2 g/dL — ABNORMAL HIGH (ref 6.5–8.1)

## 2020-09-05 MED ORDER — HYDROCODONE-ACETAMINOPHEN 7.5-325 MG PO TABS: 1.0000 | ORAL_TABLET | Freq: Four times a day (QID) | ORAL | 0 refills | Status: DC | PRN

## 2020-09-05 MED ORDER — TAMSULOSIN HCL 0.4 MG PO CAPS: 0.4000 mg | ORAL_CAPSULE | Freq: Every day | ORAL | 0 refills | Status: DC

## 2020-09-05 MED ORDER — ONDANSETRON 8 MG PO TBDP
8.0000 mg | ORAL_TABLET | Freq: Once | ORAL | Status: AC
  Administered 2020-09-05: 8 mg via ORAL

## 2020-09-05 MED ORDER — ONDANSETRON 8 MG PO TBDP: 4.0000 mg | ORAL_TABLET | Freq: Three times a day (TID) | ORAL | 0 refills | Status: AC | PRN

## 2020-09-05 MED ORDER — ONDANSETRON 8 MG PO TBDP: 4.0000 mg | ORAL_TABLET | Freq: Three times a day (TID) | ORAL | 0 refills | Status: DC | PRN

## 2020-09-05 NOTE — Telephone Encounter (Signed)
Started having abdominal pain Sunday. Constant now. Pain 5/10. Hurts at the belly button. States he "feels like he has been kicked in the groin." No fever. Has vomited 5 times today. Had a normal bowel movement earlier and then diarrhea. No blood in stool.Has an appointment tomorrow. Refuses UC/ED. No availability in the practice today per Estill Bamberg. Instructed to go to ED for worsening of symptoms.  Reason for Disposition . Patient sounds very sick or weak to the triager  Answer Assessment - Initial Assessment Questions 1. LOCATION: "Where does it hurt?"      Belly button and right 2. RADIATION: "Does the pain shoot anywhere else?" (e.g., chest, back)     No 3. ONSET: "When did the pain begin?" (Minutes, hours or days ago)      Sunday 4. SUDDEN: "Gradual or sudden onset?"     Gradual 5. PATTERN "Does the pain come and go, or is it constant?"    - If constant: "Is it getting better, staying the same, or worsening?"      (Note: Constant means the pain never goes away completely; most serious pain is constant and it progresses)     - If intermittent: "How long does it last?" "Do you have pain now?"     (Note: Intermittent means the pain goes away completely between bouts)     Constant 6. SEVERITY: "How bad is the pain?"  (e.g., Scale 1-10; mild, moderate, or severe)    - MILD (1-3): doesn't interfere with normal activities, abdomen soft and not tender to touch     - MODERATE (4-7): interferes with normal activities or awakens from sleep, tender to touch     - SEVERE (8-10): excruciating pain, doubled over, unable to do any normal activities       Now - 5 7. RECURRENT SYMPTOM: "Have you ever had this type of stomach pain before?" If Yes, ask: "When was the last time?" and "What happened that time?"      No 8. CAUSE: "What do you think is causing the stomach pain?"     Unsure 9. RELIEVING/AGGRAVATING FACTORS: "What makes it better or worse?" (e.g., movement, antacids, bowel movement)      No 10. OTHER SYMPTOMS: "Has there been any vomiting, diarrhea, constipation, or urine problems?"       Last BM - today  Protocols used: ABDOMINAL PAIN - MALE-A-AH

## 2020-09-05 NOTE — ED Triage Notes (Signed)
Patient c/o abdominal pain that started on Sunday. States it lasted about one hour and went away. He states he vomited 2-3 times. States the pain returned this morning and he has vomited 5-6 times. He states he is having pressure that feels like he has to have a BM.

## 2020-09-05 NOTE — ED Notes (Signed)
[place]. No PA needed for CT abdomen and pelvis wo contrast.

## 2020-09-05 NOTE — Discharge Instructions (Signed)
Your labs show that you are dehydrated so it is imperative that you increase your fluid intake by a lot.  I have sent a nausea medication to hopefully that helps the nausea and you able to hold down fluids.  If you cannot you may need to return or go to the emergency department for IV hydration.  Your CT scan shows that you have a large right sided kidney stone that is trying to pass into your bladder.  This is the cause of your pain.  I have sent a medication called Flomax to help dilate the ureter so the stone can pass into the bladder which should relieve your pain somewhat.  Additionally, I have sent Norco for pain relief.  I will place a referral to urology so hopefully they can see you as soon as possible.  It is possible that you may not be able to pass the stone.  If you experience a fever, increased pain or are having decreased urine output, or intractable nausea/vomiting you need to go to the emergency department for evaluation.

## 2020-09-05 NOTE — ED Provider Notes (Signed)
[facility]    CSN: [id number] Arrival date & time: 09/05/20  1417      History   Chief Complaint Chief Complaint  Patient presents with  . Abdominal Pain  . Vomiting    HPI Darren Page is a 65 y.o. male lower abdominal pain x5 hours.  It is associated with nausea and multiple episodes of vomiting.  Patient says he has vomited about 5 or 6 times today.  He says that it feels like there is a lot of pressure in his lower abdomen like he needs to have a bowel movement.  He says that he did have a bowel movement earlier today followed by some loose stools.  Patient says the abdominal pain is constant and worse when he is lying down or sitting.  Pain improves a little bit when he stands.  He denies any fever or fatigue.  No chills or sweats.  No dysuria or urinary frequency.  He says that his urine output is a little bit decreased even though he has been trying to stay hydrated.  No testicular pain or swelling.  Patient says that 3 days ago he had an episode of lower abdominal pain that lasted about an hour and went away he said that he vomited about 2-3 times then.  Apparently he had no abdominal pain the last 2 days until it returned today.  He denies any similar problems in the past.  Past medical history significant for hypertension, hyperlipidemia, COPD, CAD, tobacco use disorder.  Has had previous inguinal hernia repair on the right side many years ago.  He has no other complaints or concerns today.  HPI  Past Medical History:  Diagnosis Date  . COPD (chronic obstructive pulmonary disease) ([place])   . History of sebaceous cyst 07/19/2017   Left shoulder  . Hyperlipidemia   . Hypertension     Patient Active Problem List   Diagnosis Date Noted  . Positive fecal immunochemical test   . Polyp of transverse colon   . Coronary artery disease 11/22/2012  . Hypercholesterolemia 11/22/2012  . Tobacco use disorder 11/22/2012    Past Surgical History:  Procedure  Laterality Date  . COLONOSCOPY WITH PROPOFOL N/A 08/23/2019   Procedure: COLONOSCOPY WITH PROPOFOL;  Surgeon: Lucilla Lame, MD;  Location: [facility] END[facility]COPY;  Service: Endoscopy;  Laterality: N/A;  priority 3  . SKIN CANCER EXCISION     mohs nose       Home Medications    Prior to Admission medications   Medication Sig Start Date End Date Taking? Authorizing Provider  aspirin 81 MG chewable tablet Chew 81 mg by mouth.   Yes [provider]  HYDROcodone-acetaminophen (NORCO) 7.5-325 MG tablet Take 1 tablet by mouth every 6 (six) hours as needed for up to 5 days for moderate pain. 09/05/20 09/10/20 Yes Laurene Footman B, PA-C  metoprolol succinate (TOPROL-XL) 25 MG 24 hr tablet Take 25 mg by mouth. 02/08/15  Yes [provider]  nitroGLYCERIN (NITROSTAT) 0.4 MG SL tablet Place 0.4 mg under the tongue. 02/08/15  Yes [provider]  ondansetron (ZOFRAN ODT) 8 MG disintegrating tablet Take 0.5 tablets (4 mg total) by mouth every 8 (eight) hours as needed for up to 10 days for nausea or vomiting. 09/05/20 09/15/20 Yes Laurene Footman B, PA-C  rosuvastatin (CRESTOR) 5 MG tablet Take 5 mg by mouth daily. cardio 07/19/18  Yes [provider]  tamsulosin (FLOMAX) 0.4 MG CAPS capsule Take 1 capsule (0.4 mg total) by  mouth daily. 09/05/20 10/05/20 Yes Danton Clap, PA-C  acetaminophen (TYLENOL) 500 MG tablet Take 1,000 mg by mouth.    [provider]  nicotine (NICODERM CQ) 7 mg/24hr patch Place 1 patch (7 mg total) onto the skin daily. 11/03/19   Juline Patch, MD  Omega-3 1000 MG CAPS Take 1 capsule by mouth daily. 11/29/12   [provider]    Family History Family History  Problem Relation Age of Onset  . Cancer Mother   . Healthy Father     Social History Social History   Tobacco Use  . Smoking status: Former Smoker    Types: Cigarettes  . Smokeless tobacco: Never Used  . Tobacco comment: start back on patches  Vaping Use  . Vaping Use: Never used   Substance Use Topics  . Alcohol use: No  . Drug use: No     Allergies   Atorvastatin   Review of Systems Review of Systems  Constitutional: Negative for fatigue and fever.  Respiratory: Negative for shortness of breath.   Cardiovascular: Negative for chest pain.  Gastrointestinal: Positive for abdominal pain, nausea and vomiting. Negative for abdominal distention, anal bleeding, blood in stool, constipation, diarrhea and rectal pain.  Genitourinary: Positive for decreased urine volume. Negative for dysuria, frequency, genital sores, hematuria, penile discharge, penile pain, penile swelling, scrotal swelling, testicular pain and urgency.  Musculoskeletal: Positive for back pain (mild lower back pain). Negative for arthralgias.  Skin: Negative for rash.  Neurological: Negative for weakness.     Physical Exam Triage Vital Signs ED Triage Vitals  Enc Vitals Group     BP 09/05/20 1438 137/73     Pulse Rate 09/05/20 1438 60     Resp 09/05/20 1438 18     Temp 09/05/20 1438 97.7 F (36.5 C)     Temp Source 09/05/20 1438 Oral     SpO2 09/05/20 1438 100 %     Weight 09/05/20 1436 190 lb (86.2 kg)     Height 09/05/20 1436 5\' 11"  (1.803 m)     Head Circumference --      Peak Flow --      Pain Score 09/05/20 1435 4     Pain Loc --      Pain Edu? --      Excl. in [place]? --    No data found.  Updated Vital Signs BP 137/73 (BP Location: Left Arm)   Pulse 60   Temp 97.7 F (36.5 C) (Oral)   Resp 18   Ht 5\' 11"  (1.803 m)   Wt 190 lb (86.2 kg)   SpO2 100%   BMI 26.50 kg/m        Physical Exam Vitals and nursing note reviewed.  Constitutional:      General: He is not in acute distress.    Appearance: Normal appearance. He is well-developed. He is not ill-appearing or diaphoretic.     Comments: Appears to be uncomfortable. Standing up in exam room  HENT:     Head: Normocephalic and atraumatic.  Eyes:     General: No scleral icterus.    Conjunctiva/sclera: Conjunctivae  normal.  Cardiovascular:     Rate and Rhythm: Normal rate and regular rhythm.     Heart sounds: Normal heart sounds.  Pulmonary:     Effort: Pulmonary effort is normal. No respiratory distress.     Breath sounds: Normal breath sounds.  Abdominal:     Palpations: Abdomen is soft.     Tenderness:  There is abdominal tenderness in the right lower quadrant, periumbilical area, suprapubic area and left lower quadrant.  Musculoskeletal:     Cervical back: Neck supple.  Skin:    General: Skin is warm and dry.  Neurological:     General: No focal deficit present.     Mental Status: He is alert. Mental status is at baseline.     Motor: No weakness.     Gait: Gait normal.  Psychiatric:        Mood and Affect: Mood normal.        Behavior: Behavior normal.        Thought Content: Thought content normal.      UC Treatments / Results  Labs (all labs ordered are listed, but only abnormal results are displayed) Labs Reviewed  URINALYSIS, COMPLETE (UACMP) WITH MICROSCOPIC - Abnormal; Notable for the following components:      Result Value   Specific Gravity, Urine >1.030 (*)    Hgb urine dipstick TRACE (*)    Ketones, ur 40 (*)    Protein, ur 30 (*)    All other components within normal limits  COMPREHENSIVE METABOLIC PANEL - Abnormal; Notable for the following components:   Glucose, Bld 125 (*)    BUN 29 (*)    Creatinine, Ser 1.32 (*)    Total Protein 8.2 (*)    GFR, Estimated 59 (*)    All other components within normal limits  CBC WITH DIFFERENTIAL/PLATELET - Abnormal; Notable for the following components:   Hemoglobin 17.9 (*)    HCT 52.8 (*)    All other components within normal limits    EKG   Radiology CT ABDOMEN PELVIS WO CONTRAST  Result Date: 09/05/2020 CLINICAL DATA:  Abdominal pain, vomiting EXAM: CT ABDOMEN AND PELVIS WITHOUT CONTRAST TECHNIQUE: Multidetector CT imaging of the abdomen and pelvis was performed following the standard protocol without IV contrast.  COMPARISON:  None. FINDINGS: Lower chest: Lung bases are clear. No effusions. Heart is normal size. Hepatobiliary: Layering gallstones within the gallbladder. Scattered hypodensities in the liver most compatible with cysts. No biliary ductal dilatation. Pancreas: No focal abnormality or ductal dilatation. Spleen: No focal abnormality.  Normal size. 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 Moderate right hydronephrosis and perinephric stranding due to 7 mm right UVJ stone. No stones or hydronephrosis on the left. Adrenal glands and urinary bladder unremarkable. Stomach/Bowel: Left colonic diverticulosis. No active diverticulitis. Normal appendix. Stomach and small bowel decompressed, unremarkable. Vascular/Lymphatic: Aortic atherosclerosis. 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 Aneurysmal dilatation of the distal left common iliac artery measuring up to 2.6 cm. No adenopathy. Reproductive: Prostate enlargement. Other: No free fluid or free air. Small bilateral inguinal hernias containing fat. Musculoskeletal: No acute bony abnormality. IMPRESSION: Pelvic right kidney. Moderate right hydronephrosis and perinephric stranding due to 7 mm right UVJ stone. Cholelithiasis. 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 Aortoiliac atherosclerosis. Left colonic diverticulosis. Electronically Signed   By: Rolm Baptise M.D.   On: 09/05/2020 17:06    Procedures Procedures (including critical care time)  Medications Ordered in UC Medications  ondansetron (ZOFRAN-ODT) disintegrating tablet 8 mg (8 mg Oral Given 09/05/20 1510)    Initial Impression / Assessment and Plan / UC Course  I have reviewed the triage vital signs and the nursing notes.  Pertinent labs & imaging results that were available during my  care of the patient were reviewed by me and considered in my medical  decision making (see chart for details).   65 year old male presenting for lower abdominal as well as periumbilical abdominal pain for 5 hours.  Pain has been constant and worsening.  This is some mild lower back aching.  Associated nausea and vomiting.  All vital signs are normal and stable.  Patient is ill-appearing and does appear to be in pain.  Nontoxic and nondiaphoretic.  Abdominal exam is significant for TTP of the periumbilical, right lower quadrant, suprapubic and left lower quadrant areas.  Appears to have most tenderness to the periumbilical region.  DDX: Diverticulitis, bowel obstruction, nephrolithiasis, appendicitis, enteritis, urinary retention  UA today shows greater than 1.030 specific gravity, trace blood, ketones and protein.  CBC shows elevated hemoglobin at 17.9.  CMP shows glucose elevated at 125, BUN elevated at 29 and creatinine elevated at 1.32.  The last creatinine I can find was in 2019 which was 1.03.  No history of chronic kidney disease.  Suspect some level of dehydration and hemoconcentration.  Patient given 8 mg ODT Zofran in clinic.  CT abdomen and pelvis obtained today to assess for DDxs  CT scan shows right pelvic kidney with 7 mm UVJ stone and moderate hydronephrosis.  There is perinephric stranding related to the stone. Unsure if he will be able to pass the stone on his own. Other findings include cholelithiasis and diverticulosis.  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  Reviewed results with patient.  Advised him that his symptoms are due to the kidney stone.  I will place a urgent referral to urology.  Treating at this time with tamsulosin, Zofran, and Norco for pain relief.  Advised him that his labs are consistent with dehydration.  I did offer IV hydration today but he declined it.  He stated that he was confident he could drink enough water on his own.  I did review all ED red flag signs and symptoms.  Advised him to go  to the ER if he develops a fever, worsening abdominal pain, intractable nausea/vomiting, weakness or dehydration symptoms worsening, or decreased urine output.  Advised patient if he has not heard from urology to contact the phone number on Monday.  Advised him to follow-up with our clinic as needed.  Final Clinical Impressions(s) / UC Diagnoses   Final diagnoses:  Nephrolithiasis  Lower abdominal pain  Non-intractable vomiting with nausea, unspecified vomiting type     Discharge Instructions     Your labs show that you are dehydrated so it is imperative that you increase your fluid intake by a lot.  I have sent a nausea medication to hopefully that helps the nausea and you able to hold down fluids.  If you cannot you may need to return or go to the emergency department for IV hydration.  Your CT scan shows that you have a large right sided kidney stone that is trying to pass into your bladder.  This is the cause of your pain.  I have sent a medication called Flomax to help dilate the ureter so the stone can pass into the bladder which should relieve your pain somewhat.  Additionally, I have sent Norco for pain relief.  I will place a referral to urology so hopefully they can see you as soon as possible.  It is possible that you may not be able to pass the stone.  If you experience a fever, increased pain or are  having decreased urine output, or intractable nausea/vomiting you need to go to the emergency department for evaluation.    ED Prescriptions    Medication Sig Dispense Auth. Provider   ondansetron (ZOFRAN ODT) 8 MG disintegrating tablet Take 0.5 tablets (4 mg total) by mouth every 8 (eight) hours as needed for up to 10 days for nausea or vomiting. 30 tablet Laurene Footman B, PA-C   tamsulosin (FLOMAX) 0.4 MG CAPS capsule Take 1 capsule (0.4 mg total) by mouth daily. 30 capsule Danton Clap, PA-C   HYDROcodone-acetaminophen (NORCO) 7.5-325 MG tablet Take 1 tablet by mouth every 6  (six) hours as needed for up to 5 days for moderate pain. 20 tablet Danton Clap, PA-C     I have reviewed the PDMP during this encounter.   Danton Clap, PA-C 09/05/20 1754

## 2020-09-06 ENCOUNTER — Ambulatory Visit: Payer: BLUE CROSS/BLUE SHIELD | Admitting: Family Medicine

## 2020-09-10 ENCOUNTER — Other Ambulatory Visit: Payer: Self-pay

## 2020-09-10 DIAGNOSIS — N200 Calculus of kidney: Secondary | ICD-10-CM

## 2020-09-11 ENCOUNTER — Other Ambulatory Visit: Payer: Self-pay | Admitting: Urology

## 2020-09-11 ENCOUNTER — Other Ambulatory Visit
Admission: RE | Admit: 2020-09-11 | Discharge: 2020-09-11 | Disposition: A | Discharge status: Home / Self Care | Payer: Medicare Other | Attending: Urology | Admitting: Urology

## 2020-09-11 ENCOUNTER — Ambulatory Visit (INDEPENDENT_AMBULATORY_CARE_PROVIDER_SITE_OTHER): Payer: Medicare Other | Admitting: Urology

## 2020-09-11 ENCOUNTER — Encounter: Payer: Self-pay | Admitting: Urology

## 2020-09-11 ENCOUNTER — Other Ambulatory Visit: Payer: Self-pay

## 2020-09-11 VITALS — BP 121/75 | HR 54 | Ht 71.0 in | Wt 197.0 lb

## 2020-09-11 DIAGNOSIS — I729 Aneurysm of unspecified site: Secondary | ICD-10-CM

## 2020-09-11 DIAGNOSIS — Q632 Ectopic kidney: Secondary | ICD-10-CM

## 2020-09-11 DIAGNOSIS — N201 Calculus of ureter: Secondary | ICD-10-CM

## 2020-09-11 DIAGNOSIS — N200 Calculus of kidney: Secondary | ICD-10-CM

## 2020-09-11 LAB — URINALYSIS, COMPLETE (UACMP) WITH MICROSCOPIC
Bacteria, UA: NONE SEEN
Bilirubin Urine: NEGATIVE
Glucose, UA: NEGATIVE mg/dL
Ketones, ur: NEGATIVE mg/dL
Leukocytes,Ua: NEGATIVE
Nitrite: NEGATIVE
Protein, ur: NEGATIVE mg/dL
Specific Gravity, Urine: >1.03 — ABNORMAL HIGH (ref 1.005–1.030)
Squamous Epithelial / HPF: NONE SEEN (ref 0–5)
WBC, UA: NONE SEEN WBC/hpf (ref 0–5)
pH: 5.5 (ref 5.0–8.0)

## 2020-09-11 MED ORDER — HYDROCODONE-ACETAMINOPHEN 5-325 MG PO TABS: 1.0000 | ORAL_TABLET | Freq: Four times a day (QID) | ORAL | 0 refills | Status: DC | PRN

## 2020-09-11 NOTE — Patient Instructions (Signed)
Laser Therapy for Kidney Stones Laser therapy for kidney stones is a procedure to break up small, hard mineral deposits that form in the kidney (kidney stones). The procedure is done using a device that produces a focused beam of light (laser). The laser breaks up kidney stones into pieces that are small enough to be passed out of the body through urination or removed from the body during the procedure. You may need laser therapy if you have kidney stones that are painful or block your urinary tract. This procedure is done by inserting a tube (ureteroscope) into your kidney through the urethral opening. The urethra is the part of the body that drains urine from the bladder. In women, the urethra opens above the vaginal opening. In men, the urethra opens at the tip of the penis. The ureteroscope is inserted through the urethra, and surgical instruments are moved through the bladder and the muscular tube that connects the kidney to the bladder (ureter) until they reach the kidney. Tell a health care provider about:  Any allergies you have.  All medicines you are taking, including vitamins, herbs, eye drops, creams, and over-the-counter medicines.  Any problems you or family members have had with anesthetic medicines.  Any blood disorders you have.  Any surgeries you have had.  Any medical conditions you have.  Whether you are pregnant or may be pregnant. What are the risks? Generally, this is a safe procedure. However, problems may occur, including:  Infection.  Bleeding.  Allergic reactions to medicines.  Damage to the urethra, bladder, or ureter.  Urinary tract infection (UTI).  Narrowing of the urethra (urethral stricture).  Difficulty passing urine.  Blockage of the kidney caused by a fragment of kidney stone. What happens before the procedure? Medicines  Ask your health care provider about: ? Changing or stopping your regular medicines. This is especially important if you  are taking diabetes medicines or blood thinners. ? Taking medicines such as aspirin and ibuprofen. These medicines can thin your blood. Do not take these medicines unless your health care provider tells you to take them. ? Taking over-the-counter medicines, vitamins, herbs, and supplements. Eating and drinking Follow instructions from your health care provider about eating and drinking, which may include:  8 hours before the procedure - stop eating heavy meals or foods, such as meat, fried foods, or fatty foods.  6 hours before the procedure - stop eating light meals or foods, such as toast or cereal.  6 hours before the procedure - stop drinking milk or drinks that contain milk.  2 hours before the procedure - stop drinking clear liquids. Staying hydrated Follow instructions from your health care provider about hydration, which may include:  Up to 2 hours before the procedure - you may continue to drink clear liquids, such as water, clear fruit juice, black coffee, and plain tea.   General instructions  You may have a physical exam before the procedure. You may also have tests, such as imaging tests and blood or urine tests.  If your ureter is too narrow, your health care provider may place a soft, flexible tube (stent) inside of it. The stent may be placed days or weeks before your laser therapy procedure.  Plan to have someone take you home from the hospital or clinic.  If you will be going home right after the procedure, plan to have someone stay with you for 24 hours.  Do not use any products that contain nicotine or tobacco for at least  4 weeks before the procedure. These products include cigarettes, e-cigarettes, and chewing tobacco. If you need help quitting, ask your health care provider.  Ask your health care provider: ? How your surgical site will be marked or identified. ? What steps will be taken to help prevent infection. These may include:  Removing hair at the surgery  site.  Washing skin with a germ-killing soap.  Taking antibiotic medicine. What happens during the procedure?  An IV will be inserted into one of your veins.  You will be given one or more of the following: ? A medicine to help you relax (sedative). ? A medicine to numb the area (local anesthetic). ? A medicine to make you fall asleep (general anesthetic).  A ureteroscope will be inserted into your urethra. The ureteroscope will send images to a video screen in the operating room to guide your surgeon to the area of your kidney that will be treated.  A small, flexible tube will be threaded through the ureteroscope and into your bladder and ureter, up to your kidney.  The laser device will be inserted into your kidney through the tube. Your surgeon will pulse the laser on and off to break up kidney stones.  A surgical instrument that has a tiny wire basket may be inserted through the tube into your kidney to remove the pieces of broken kidney stone. The procedure may vary among health care providers and hospitals.   What happens after the procedure?  Your blood pressure, heart rate, breathing rate, and blood oxygen level will be monitored until you leave the hospital or clinic.  You will be given pain medicine as needed.  You may continue to receive antibiotics.  You may have a stent temporarily placed in your ureter.  Do not drive for 24 hours if you were given a sedative during your procedure.  You may be given a strainer to collect any stone fragments that you pass in your urine. Your health care provider may have these tested. Summary  Laser therapy for kidney stones is a procedure to break up kidney stones into pieces that are small enough to be passed out of the body through urination or removed during the procedure.  Follow instructions from your health care provider about eating and drinking before the procedure.  During the procedure, the ureteroscope will send images  to a video screen to guide your surgeon to the area of your kidney that will be treated.  Do not drive for 24 hours if you were given a sedative during your procedure. This information is not intended to replace advice given to you by your health care provider. Make sure you discuss any questions you have with your health care provider. Document Revised: 01/28/2018 Document Reviewed: 01/28/2018 Elsevier Patient Education  2021 [employer].   Ureteral Stent Implantation  Ureteral stent implantation is a procedure to insert (implant) a flexible, soft, plastic tube (stent) into a ureter. Ureters are the tube-like parts of the body that drain urine from the kidneys. The stent supports the ureter while it heals and helps to drain urine. You may have a ureteral stent implanted after having a procedure to remove a blockage from the ureter (ureterolysis or pyeloplasty). You may also have a stent implanted to open the flow of urine when you have a blockage caused by a kidney stone, tumor, blood clot, or infection. You have two ureters, one on each side of the body. The ureters connect the kidneys to the organ that  holds urine until it passes out of the body (bladder). The stent is placed so that one end is in the kidney, and one end is in the bladder. The stent is usually taken out after your ureter has healed. Depending on your condition, you may have a stent for just a few weeks, or you may have a long-term stent that will need to be replaced every few months. Tell a health care provider about:  Any allergies you have.  All medicines you are taking, including vitamins, herbs, eye drops, creams, and over-the-counter medicines.  Any problems you or family members have had with anesthetic medicines.  Any blood disorders you have.  Any surgeries you have had.  Any medical conditions you have.  Whether you are pregnant or may be pregnant. What are the risks? Generally, this is a safe procedure.  However, problems may occur, including:  Infection.  Bleeding.  Allergic reactions to medicines.  Damage to other structures or organs. Tearing (perforation) of the ureter is possible.  Movement of the stent away from where it is placed during surgery (migration). What happens before the procedure? Medicines Ask your health care provider about:  Changing or stopping your regular medicines. This is especially important if you are taking diabetes medicines or blood thinners.  Taking medicines such as aspirin and ibuprofen. These medicines can thin your blood. Do not take these medicines unless your health care provider tells you to take them.  Taking over-the-counter medicines, vitamins, herbs, and supplements. Eating and drinking Follow instructions from your health care provider about eating and drinking, which may include:  8 hours before the procedure - stop eating heavy meals or foods, such as meat, fried foods, or fatty foods.  6 hours before the procedure - stop eating light meals or foods, such as toast or cereal.  6 hours before the procedure - stop drinking milk or drinks that contain milk.  2 hours before the procedure - stop drinking clear liquids. Staying hydrated Follow instructions from your health care provider about hydration, which may include:  Up to 2 hours before the procedure - you may continue to drink clear liquids, such as water, clear fruit juice, black coffee, and plain tea. General instructions  Do not drink alcohol.  Do not use any products that contain nicotine or tobacco for at least 4 weeks before the procedure. These products include cigarettes, e-cigarettes, and chewing tobacco. If you need help quitting, ask your health care provider.  You may have an exam or testing, such as imaging or blood tests.  Ask your health care provider what steps will be taken to help prevent infection. These may include: ? Removing hair at the surgery  site. ? Washing skin with a germ-killing soap. ? Taking antibiotic medicine.  Plan to have someone take you home from the hospital or clinic.  If you will be going home right after the procedure, plan to have someone with you for 24 hours. What happens during the procedure?  An IV will be inserted into one of your veins.  You may be given a medicine to help you relax (sedative).  You may be given a medicine to make you fall asleep (general anesthetic).  A thin, tube-shaped instrument with a light and tiny camera at the end (cystoscope) will be inserted into your urethra. The urethra is the tube that drains urine from the bladder out of the body. In men, the urethra opens at the end of the penis. In women, the  urethra opens in front of the vaginal opening.  The cystoscope will be passed into your bladder.  A thin wire (guide wire) will be passed through your bladder and into your ureter. This is used to guide the stent into your ureter.  The stent will be inserted into your ureter.  The guide wire and the cystoscope will be removed.  A flexible tube (catheter) may be inserted through your urethra so that one end is in your bladder. This helps to drain urine from your bladder. The procedure may vary among hospitals and health care providers. What happens after the procedure?  Your blood pressure, heart rate, breathing rate, and blood oxygen level will be monitored until you leave the hospital or clinic.  You may continue to receive medicine and fluids through an IV.  You may have some soreness or pain in your abdomen and urethra. Medicines will be available to help you.  You will be encouraged to get up and walk around as soon as you can.  You may have a catheter draining your urine.  You will have some blood in your urine.  Do not drive for 24 hours if you were given a sedative during your procedure. Summary  Ureteral stent implantation is a procedure to insert a flexible,  soft, plastic tube (stent) into a ureter.  You may have a stent implanted to support the ureter while it heals after a procedure or to open the flow of urine if there is a blockage.  Follow instructions from your health care provider about taking medicines and about eating and drinking before the procedure.  Depending on your condition, you may have a stent for just a few weeks, or you may have a long-term stent that will need to be replaced every few months. This information is not intended to replace advice given to you by your health care provider. Make sure you discuss any questions you have with your health care provider. Document Revised: 02/23/2018 Document Reviewed: 02/24/2018 Elsevier Patient Education  2021 [employer].

## 2020-09-11 NOTE — Progress Notes (Signed)
09/11/20 11:01 AM   Dorna Bloom [place] 24-Aug-1955 [id number]  CC: Right ureteral stone, nausea vomiting, right pelvic kidney  HPI: 65 year old male with history of COPD and CAD who reports 1 to 2 weeks of intermittent severe right pelvic and flank pain and nausea vomiting.  A CT was performed on 09/05/2020 that showed a 7 mm right distal ureteral stone with a right pelvic kidney and moderate hydronephrosis.  There were no other renal stones.  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  He does not take any anticoagulation aside from a baby aspirin.  He denies any fevers or chills.  He reports 3 prior stone episodes, all passed spontaneously.  Urinalysis today with 6-10 RBCs, otherwise benign.   PMH: Past Medical History:  Diagnosis Date  . COPD (chronic obstructive pulmonary disease) ([place])   . History of sebaceous cyst 07/19/2017   Left shoulder  . Hyperlipidemia   . Hypertension     Surgical History: Past Surgical History:  Procedure Laterality Date  . COLONOSCOPY WITH PROPOFOL N/A 08/23/2019   Procedure: COLONOSCOPY WITH PROPOFOL;  Surgeon: Lucilla Lame, MD;  Location: [facility] ENDOSCOPY;  Service: Endoscopy;  Laterality: N/A;  priority 3  . SKIN CANCER EXCISION     mohs nose   Family History: Family History  Problem Relation Age of Onset  . Cancer Mother   . Healthy Father     Social History:  reports that he has quit smoking. His smoking use included cigarettes. He has never used smokeless tobacco. He reports that he does not drink alcohol and does not use drugs.  Physical Exam: BP 121/75   Pulse (!) 54   Ht 5\' 11"  (1.803 m)   Wt 197 lb (89.4 kg)   BMI 27.48 kg/m    Constitutional:  Alert and oriented, No acute distress. Cardiovascular: No clubbing, cyanosis, or edema. Respiratory: Normal respiratory effort, no increased work of breathing. GI: Abdomen is soft, nontender, nondistended, no abdominal masses  Laboratory  Data: Reviewed, see HPI  Pertinent Imaging: I have personally viewed and interpreted the CT dated 09/05/2020 showing a 7 mm right distal ureteral stone with moderate upstream hydronephrosis and a pelvic kidney, and no renal stones.  Assessment & Plan:   65 year old male with COPD and CAD with 1 to 2 weeks of flank pain and nausea vomiting from a 7 mm right distal ureteral stone and pelvic kidney.  Persistent microscopic hematuria today, but no clinical or laboratory evidence of infection.  We discussed various treatment options for urolithiasis including observation with or without medical expulsive therapy, shockwave lithotripsy (SWL), ureteroscopy and laser lithotripsy with stent placement, and percutaneous nephrolithotomy.  We discussed that management is based on stone size, location, density, patient co-morbidities, and patient preference.   Stones <20mm in size have a >80% spontaneous passage rate. Data surrounding the use of tamsulosin for medical expulsive therapy is controversial, but meta analyses suggests it is most efficacious for distal stones between 5-58mm in size. Possible side effects include dizziness/lightheadedness, and retrograde ejaculation.  SWL has a lower stone free rate in a single procedure, but also a lower complication rate compared to ureteroscopy and avoids a stent and associated stent related symptoms. Possible complications include renal hematoma, steinstrasse, and need for additional treatment.  Ureteroscopy with laser lithotripsy and stent placement has a higher stone free rate than SWL in a single procedure, however increased complication rate including possible infection, ureteral injury, bleeding, and stent related morbidity. Common stent related symptoms  include dysuria, urgency/frequency, and flank pain.  After an extensive discussion of the risks and benefits of the above treatment options, the patient would like to proceed with right ureteroscopy, laser  lithotripsy, stent placement.  -Will get cardiology clearance this week, okay to continue baby aspirin through surgery -Schedule right ureteroscopy, laser lithotripsy, stent placement this Friday -I contacted Dr. Lucky Cowboy and Dr. Delana Meyer in vascular surgery via epic regarding his common iliac artery aneurysms, and possible need for follow-up with them  Nickolas Madrid, MD 09/11/2020  [facility] [address] [place], [place] [address] [phone]

## 2020-09-11 NOTE — H&P (View-Only) (Signed)
09/11/20 11:01 AM   Darren Page 06/04/1955 [id number]  CC: Right ureteral stone, nausea vomiting, right pelvic kidney  HPI: 65 year old male with history of COPD and CAD who reports 1 to 2 weeks of intermittent severe right pelvic and flank pain and nausea vomiting.  A CT was performed on 09/05/2020 that showed a 7 mm right distal ureteral stone with a right pelvic kidney and moderate hydronephrosis.  There were no other renal stones.  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  He does not take any anticoagulation aside from a baby aspirin.  He denies any fevers or chills.  He reports 3 prior stone episodes, all passed spontaneously.  Urinalysis today with 6-10 RBCs, otherwise benign.   PMH: Past Medical History:  Diagnosis Date  . COPD (chronic obstructive pulmonary disease) ([place])   . History of sebaceous cyst 07/19/2017   Left shoulder  . Hyperlipidemia   . Hypertension     Surgical History: Past Surgical History:  Procedure Laterality Date  . COLONOSCOPY WITH PROPOFOL N/A 08/23/2019   Procedure: COLONOSCOPY WITH PROPOFOL;  Surgeon: Lucilla Lame, MD;  Location[facility] END[facility]COPY;  Service: Endoscopy;  Laterality: N/A;  priority 3  . SKIN CANCER EXCISION     mohs nose   Family History: Family History  Problem Relation Age of Onset  . Cancer Mother   . Healthy Father     Social History:  reports that he has quit smoking. His smoking use included cigarettes. He has never used smokeless tobacco. He reports that he does not drink alcohol and does not use drugs.  Physical Exam: BP 121/75   Pulse (!) 54   Ht 5\' 11"  (1.803 m)   Wt 197 lb (89.4 kg)   BMI 27.48 kg/m    Constitutional:  Alert and oriented, No acute distress. Cardiovascular: No clubbing, cyanosis, or edema. Respiratory: Normal respiratory effort, no increased work of breathing. GI: Abdomen is soft, nontender, nondistended, no abdominal masses  Laboratory  Data: Reviewed, see HPI  Pertinent Imaging: I have personally viewed and interpreted the CT dated 09/05/2020 showing a 7 mm right distal ureteral stone with moderate upstream hydronephrosis and a pelvic kidney, and no renal stones.  Assessment & Plan:   65 year old male with COPD and CAD with 1 to 2 weeks of flank pain and nausea vomiting from a 7 mm right distal ureteral stone and pelvic kidney.  Persistent microscopic hematuria today, but no clinical or laboratory evidence of infection.  We discussed various treatment options for urolithiasis including observation with or without medical expulsive therapy, shockwave lithotripsy (SWL), ureteroscopy and laser lithotripsy with stent placement, and percutaneous nephrolithotomy.  We discussed that management is based on stone size, location, density, patient co-morbidities, and patient preference.   Stones <26mm in size have a >80% spontaneous passage rate. Data surrounding the use of tamsulosin for medical expulsive therapy is controversial, but meta analyses suggests it is most efficacious for distal stones between 5-37mm in size. Possible side effects include dizziness/lightheadedness, and retrograde ejaculation.  SWL has a lower stone free rate in a single procedure, but also a lower complication rate compared to ureteroscopy and avoids a stent and associated stent related symptoms. Possible complications include renal hematoma, steinstrasse, and need for additional treatment.  Ureteroscopy with laser lithotripsy and stent placement has a higher stone free rate than SWL in a single procedure, however increased complication rate including possible infection, ureteral injury, bleeding, and stent related morbidity. Common stent related symptoms  include dysuria, urgency/frequency, and flank pain.  After an extensive discussion of the risks and benefits of the above treatment options, the patient would like to proceed with right ureteroscopy, laser  lithotripsy, stent placement.  -Will get cardiology clearance this week, okay to continue baby aspirin through surgery -Schedule right ureteroscopy, laser lithotripsy, stent placement this Friday -I contacted Dr. Lucky Cowboy and Dr. Delana Meyer in vascular surgery via epic regarding his common iliac artery aneurysms, and possible need for follow-up with them  Darren Madrid, MD 09/11/2020  [facility] [address], [place], [place] [address] [phone]

## 2020-09-12 ENCOUNTER — Other Ambulatory Visit
Admission: RE | Admit: 2020-09-12 | Discharge: 2020-09-12 | Disposition: A | Discharge status: Home / Self Care | Payer: Medicare Other | Source: Ambulatory Visit | Attending: Urology | Admitting: Urology

## 2020-09-12 DIAGNOSIS — Z20822 Contact with and (suspected) exposure to covid-19: Secondary | ICD-10-CM | POA: Diagnosis not present

## 2020-09-12 DIAGNOSIS — Z01812 Encounter for preprocedural laboratory examination: Secondary | ICD-10-CM | POA: Diagnosis present

## 2020-09-12 LAB — URINE CULTURE: Culture: <10000 — AB

## 2020-09-12 LAB — SARS CORONAVIRUS 2 (TAT 6-24 HRS): SARS Coronavirus 2: NEGATIVE

## 2020-09-12 NOTE — Addendum Note (Signed)
Addended by: [name] on: 09/12/2020 08:12 AM   Modules accepted: Orders

## 2020-09-13 ENCOUNTER — Other Ambulatory Visit
Admission: RE | Admit: 2020-09-13 | Discharge: 2020-09-13 | Disposition: A | Discharge status: Home / Self Care | Payer: Medicare Other | Source: Ambulatory Visit | Attending: Urology | Admitting: Urology

## 2020-09-13 ENCOUNTER — Other Ambulatory Visit: Payer: Self-pay

## 2020-09-13 ENCOUNTER — Encounter: Payer: Self-pay | Admitting: Urology

## 2020-09-13 HISTORY — DX: Aortic aneurysm of unspecified site, without rupture: I71.9

## 2020-09-13 HISTORY — DX: Personal history of urinary calculi: Z87.442

## 2020-09-13 HISTORY — DX: Atherosclerotic heart disease of native coronary artery without angina pectoris: I25.10

## 2020-09-13 NOTE — Patient Instructions (Signed)
Your procedure is scheduled on: Friday September 14, 2020. Report to Day Surgery inside [facility] 2nd floor (stop by admissions desk firs before getting on elevator). To find out your arrival time please call [phone] between 1PM - 3PM on Thursday September 13, 2020.  Remember: Instructions that are not followed completely may result in serious medical risk,  up to and including death, or upon the discretion of your surgeon and anesthesiologist your  surgery may need to be rescheduled.     _X__ 1. Do not eat food or drink fluids after midnight the night before your procedure.                 No chewing gum or hard candies. You may drink clear liquids up to 2 hours  __X__2.  On the morning of surgery brush your teeth with toothpaste and water, you                may rinse your mouth with mouthwash if you wish.  Do not swallow any toothpaste of mouthwash.     _X__ 3.  No Alcohol for 24 hours before or after surgery.   _X__ 4.  Do Not Smoke or use e-cigarettes For 24 Hours Prior to Your Surgery.                 Do not use any chewable tobacco products for at least 6 hours prior to                 Surgery.  _X__  5.  Do not use any recreational drugs (marijuana, cocaine, heroin, ecstasy, MDMA or other)                For at least one week prior to your surgery.  Combination of these drugs with anesthesia                May have life threatening results.  __X__ 6.  Notify your doctor if there is any change in your medical condition      (cold, fever, infections).     Do not wear jewelry, make-up, hairpins, clips or nail polish. Do not wear lotions, powders, or perfumes. You may wear deodorant. Do not shave 48 hours prior to surgery. Men may shave face and neck. Do not bring valuables to the hospital.    [facility] is not responsible for any belongings or valuables.  Contacts, dentures or bridgework may not be worn into surgery. Leave your suitcase in the car.  After surgery it may be brought to your room. For patients admitted to the hospital, discharge time is determined by your treatment team.   Patients discharged the day of surgery will not be allowed to drive home.   Make arrangements for someone to be with you for the first 24 hours of your Same Day Discharge.   __X__ Take these medicines the morning of surgery with A SIP OF WATER:    1. HYDROcodone-acetaminophen (NORCO) 5-325  2. metoprolol succinate (TOPROL-XL) 25 MG 24 hr  3. tamsulosin (FLOMAX) 0.4 MG CAPS  4.  5.  6.  ____ Fleet Enema (as directed)   ____ Use CHG Soap (or wipes) as directed  ____ Use Benzoyl Peroxide Gel as instructed  ____ Use inhalers on the day of surgery  ____ Stop metformin 2 days prior to surgery    ____ Take 1/2 of usual insulin dose the night before surgery. No insulin the morning  of surgery.   ____ Stop Coumadin/Plavix/aspirin   __X__ Stop Anti-inflammatories such as Ibuprofen, Aleve, Advil, naproxen, Goody's or BC powders.    __X__ Stop supplements until after surgery.  Omega-3 1000 MG   __X__ Do not start any herbal supplements before your procedure.   If you have any questions regarding your pre-procedure instructions,  Please call Pre-admit Testing at [phone].

## 2020-09-13 NOTE — Progress Notes (Signed)
Perioperative Services  Pre-Admission/Anesthesia Testing Clinical Review  Date: 09/13/20  Patient Demographics:  Name: Darren Page DOB:   1955-10-24 MRN:   [id number]  Planned Surgical Procedure(s):    Case: [id number] Date/Time: 09/14/20 3354   Procedure: CYSTOSCOPY/URETEROSCOPY/HOLMIUM LASER/STENT PLACEMENT (Right )   Anesthesia type: Choice   Pre-op diagnosis: right Kidney Stone   Location: [place] [facility] [place] [facility]   Surgeons: [name], MD    NOTE: Available PAT nursing documentation and vital signs have been reviewed. Clinical nursing staff has updated patient's PMH/PSHx, current medication list, and drug allergies/intolerances to ensure comprehensive history available to assist in medical decision making as it pertains to the aforementioned surgical procedure and anticipated anesthetic course.   Clinical Discussion:  Darren Page is a 65 y.o. male who is submitted for pre-surgical anesthesia review and clearance prior to him undergoing the above procedure. Patient is a Former Smoker (50 pack years; quit 01/2018). Pertinent PMH includes: CAD, MI, aortic aneurysm, HTN, HLD, COPD.  Patient is followed by cardiology Madlyn Frankel, MD). He was last seen in the cardiology clinic on 08/06/2020; notes reviewed.  At the time of his clinic visit, patient noted to be doing well overall from a cardiovascular perspective. He denied chest pain, shortness of breath, orthopnea, PND, palpitations, peripheral edema, vertiginous symptoms, and presyncope/syncope.  Patient with PMH significant for cardiovascular disease and intervention.  Patient suffered an acute MI in 12/2003; PCI with DES x 1 to mid LAD.  Patient subsequently underwent a stress echocardiogram on 01/27/2018 that revealed evidence of inducible myocardial ischemia.  Diagnostic left heart catheterization performed on 02/16/2018 revealed multivessel CAD; 100% stenosis of the proximal LAD, 90%  stenosis of the proximal D1, large OM1 with serial 90% stenoses, and an 80% stenosis of the mid RPL; treated with aggressive secondary prevention (see full interpretation of cardiovascular testing and interventions below).  Patient on GDMT for his HTN and HLD diagnoses.  Blood pressure reasonably controlled at 132/80 on currently prescribed beta-blocker monotherapy.  He was on a statin for his HLD.  Functional capacity, as defined by DASI, is documented as being >/= 4 METS.  No changes were made to patient's medication regimen.  Patient to follow-up with outpatient cardiology in 6 months or sooner if needed.  Patient scheduled to undergo a urological procedure on 09/14/2020 with Dr. Nickolas Madrid. Given patient's past medical history significant for cardiovascular diagnoses, presurgical cardiac clearance was sought by the performing surgeon's office and PAT team. Per cardiology, "this patient is optimized for surgery and may proceed with the planned procedural course with a MODERATE risk stratification". This patient is on daily antiplatelet therapy.  Attending surgeon okay with patient continuing his daily low-dose ASA throughout the perioperative period.  Patient denies previous perioperative complications with anesthesia in the past. In review of the available records, it is noted that patient underwent a general anesthetic course here (ASA III) in 08/2019 without documented complications.   Vitals with BMI 09/13/2020 09/11/2020 09/05/2020  Height 5\' 11"  5\' 11"  5\' 11"   Weight 188 lbs 197 lbs 190 lbs  BMI 26.23 56.25 63.89  Systolic - 373 428  Diastolic - 75 73  Pulse - 54 60    Providers/Specialists:   NOTE: Primary physician provider listed below. Patient may have been seen by APP or partner within same practice.   PROVIDER ROLE / SPECIALTY LAST Ranae Pila, MD Urology (Surgeon)  09/11/2020  Juline Patch, MD Primary Care  Provider  07/12/2019  Stevan Born, MD Cardiology   08/06/2020   Allergies:  Ezetimibe and Atorvastatin  Current Home Medications:   No current facility-administered medications for this encounter.   Marland Kitchen aspirin 81 MG chewable tablet  . HYDROcodone-acetaminophen (NORCO) 5-325 MG tablet  . metoprolol succinate (TOPROL-XL) 25 MG 24 hr tablet  . nicotine (NICODERM CQ) 7 mg/24hr patch  . nitroGLYCERIN (NITROSTAT) 0.4 MG SL tablet  . Omega-3 1000 MG CAPS  . rosuvastatin (CRESTOR) 5 MG tablet  . tamsulosin (FLOMAX) 0.4 MG CAPS capsule  . acetaminophen (TYLENOL) 500 MG tablet  . ondansetron (ZOFRAN ODT) 8 MG disintegrating tablet   History:   Past Medical History:  Diagnosis Date  . Aneurysm of aorta (HCC)   . COPD (chronic obstructive pulmonary disease) ([place])   . Coronary artery disease   . History of acute anterior wall MI 12/2003  . History of kidney stones   . History of sebaceous cyst 07/19/2017   Left shoulder  . Hyperlipidemia   . Hypertension    Past Surgical History:  Procedure Laterality Date  . COLONOSCOPY WITH PROPOFOL N/A 08/23/2019   Procedure: COLONOSCOPY WITH PROPOFOL;  Surgeon: Lucilla Lame, MD;  [facility] END[facility]COPY;  Service: Endoscopy;  Laterality: N/A;  priority 3  . SKIN CANCER EXCISION     mohs nose   Family History  Problem Relation Age of Onset  . Cancer Mother   . Healthy Father    Social History   Tobacco Use  . Smoking status: Former Smoker    Packs/day: 1.00    Years: 50.00    Pack years: 50.00    Types: Cigarettes    Quit date: 02/03/2018    Years since quitting: 2.6  . Smokeless tobacco: Never Used  . Tobacco comment: start back on patches  Vaping Use  . Vaping Use: Never used  Substance Use Topics  . Alcohol use: Yes    Alcohol/week: 1.0 standard drink    Types: 1 Cans of beer per week    Comment: occasionally   . Drug use: No    Pertinent Clinical Results:  LABS: Labs reviewed: Acceptable for surgery.  Hospital Outpatient Visit on 09/12/2020  Component Date Value Ref Range  Status  . SARS Coronavirus 2 09/12/2020 NEGATIVE  NEGATIVE Final   Comment: (NOTE) SARS-CoV-2 target nucleic acids are NOT DETECTED.  The SARS-CoV-2 RNA is generally detectable in upper and lower respiratory specimens during the acute phase of infection. Negative results do not preclude SARS-CoV-2 infection, do not rule out co-infections with other pathogens, and should not be used as the sole basis for treatment or other patient management decisions. Negative results must be combined with clinical observations, patient history, and epidemiological information. The expected result is Negative.  Fact Sheet for Patients: SugarRoll.be  Fact Sheet for Healthcare Providers: https://www.woods-mathews.com/  This test is not yet approved or cleared by the [place] FDA and  has been authorized for detection and/or diagnosis of SARS-CoV-2 by FDA under an Emergency Use Authorization (EUA). This EUA will remain  in effect (meaning this test can be used) for the duration of the COVID-19 declaration under Se                          ction 564(b)(1) of the Act, 21 U.S.C. section 360bbb-3(b)(1), unless the authorization is terminated or revoked sooner.  Performed at [facility], [place] [address], [place], [place] [address]   Hospital Outpatient  Visit on 09/11/2020  Component Date Value Ref Range Status  . Color, Urine 09/11/2020 AMBER* YELLOW Final   BIOCHEMICALS MAY BE AFFECTED BY COLOR  . APPearance 09/11/2020 HAZY* CLEAR Final  . Specific Gravity, Urine 09/11/2020 >1.030* 1.005 - 1.030 Final  . pH 09/11/2020 5.5  5.0 - 8.0 Final  . Glucose, UA 09/11/2020 NEGATIVE  NEGATIVE mg/dL Final  . Hgb urine dipstick 09/11/2020 SMALL* NEGATIVE Final  . Bilirubin Urine 09/11/2020 NEGATIVE  NEGATIVE Final  . Ketones, ur 09/11/2020 NEGATIVE  NEGATIVE mg/dL Final  . Protein, ur 09/11/2020 NEGATIVE  NEGATIVE mg/dL Final  . Nitrite 09/11/2020 NEGATIVE   NEGATIVE Final  . Chalmers Guest 09/11/2020 NEGATIVE  NEGATIVE Final  . Squamous Epithelial / LPF 09/11/2020 NONE SEEN  0 - 5 Final  . WBC, UA 09/11/2020 NONE SEEN  0 - 5 WBC/hpf Final  . RBC / HPF 09/11/2020 6-10  0 - 5 RBC/hpf Final  . Bacteria, UA 09/11/2020 NONE SEEN  NONE SEEN Final  . Mucus 09/11/2020 PRESENT   Final   Performed at [facility], [address] [place], [place] [address]  . Specimen Description 09/11/2020    Final                   Value:URINE, RANDOM Performed at [facility], [address], [place], [place] [address]   . Special Requests 09/11/2020    Final                   Value:NONE Performed at [facility], [address], [place], [place] [address]   . Culture 09/11/2020 *  Final                   Value:<10,000 COLONIES/mL INSIGNIFICANT GROWTH Performed at [facility] [address] [place], [place] [address]   . Report Status 09/11/2020 09/12/2020 FINAL   Final  Admission on 09/05/2020, Discharged on 09/05/2020  Component Date Value Ref Range Status  . Color, Urine 09/05/2020 YELLOW  YELLOW Final  . APPearance 09/05/2020 CLEAR  CLEAR Final  . Specific Gravity, Urine 09/05/2020 >1.030* 1.005 - 1.030 Final  . pH 09/05/2020 5.5  5.0 - 8.0 Final  . Glucose, UA 09/05/2020 NEGATIVE  NEGATIVE mg/dL Final  . Hgb urine dipstick 09/05/2020 TRACE* NEGATIVE Final  . Bilirubin Urine 09/05/2020 NEGATIVE  NEGATIVE Final  . Ketones, ur 09/05/2020 40* NEGATIVE mg/dL Final  . Protein, ur 09/05/2020 30* NEGATIVE mg/dL Final  . Nitrite 09/05/2020 NEGATIVE  NEGATIVE Final  . Chalmers Guest 09/05/2020 NEGATIVE  NEGATIVE Final  . Squamous Epithelial / LPF 09/05/2020 0-5  0 - 5 Final  . WBC, UA 09/05/2020 NONE SEEN  0 - 5 WBC/hpf Final  . RBC / HPF 09/05/2020 0-5  0 - 5 RBC/hpf Final  . Bacteria, UA 09/05/2020 NONE SEEN  NONE SEEN Final  . Ca Oxalate Crys, UA 09/05/2020 PRESENT   Final   Performed at [facility], [address], [place], [place] [address]  . Sodium 09/05/2020 137  135 - 145 mmol/L Final  . Potassium 09/05/2020 4.5  3.5 - 5.1 mmol/L Final  . Chloride 09/05/2020 104  98 - 111 mmol/L Final  . CO2 09/05/2020 26  22 - 32 mmol/L Final  . Glucose, Bld 09/05/2020 125* 70 - 99 mg/dL Final   Glucose reference range applies only to samples taken after fasting for at least 8 hours.  . BUN 09/05/2020 29* 8 -  23 mg/dL Final  . Creatinine, Ser 09/05/2020 1.32* 0.61 - 1.24 mg/dL Final  . Calcium 09/05/2020 9.2  8.9 - 10.3 mg/dL Final  . Total Protein 09/05/2020 8.2* 6.5 - 8.1 g/dL Final  . Albumin 09/05/2020 4.7  3.5 - 5.0 g/dL Final  . AST 09/05/2020 29  15 - 41 U/L Final  . ALT 09/05/2020 23  0 - 44 U/L Final  . Alkaline Phosphatase 09/05/2020 68  38 - 126 U/L Final  . Total Bilirubin 09/05/2020 0.5  0.3 - 1.2 mg/dL Final  . GFR, Estimated 09/05/2020 59* >60 mL/min Final   Comment: (NOTE) Calculated using the CKD-EPI Creatinine Equation (2021)   . Anion gap 09/05/2020 7  5 - 15 Final   Performed at [facility], [address] [place], [place] [address]  . WBC 09/05/2020 9.1  4.0 - 10.5 K/uL Final  . RBC 09/05/2020 5.57  4.22 - 5.81 MIL/uL Final  . Hemoglobin 09/05/2020 17.9* 13.0 - 17.0 g/dL Final  . HCT 09/05/2020 52.8* 39.0 - 52.0 % Final  . MCV 09/05/2020 94.8  80.0 - 100.0 fL Final  . MCH 09/05/2020 32.1  26.0 - 34.0 pg Final  . MCHC 09/05/2020 33.9  30.0 - 36.0 g/dL Final  . RDW 09/05/2020 12.9  11.5 - 15.5 % Final  . Platelets 09/05/2020 239  150 - 400 K/uL Final  . nRBC 09/05/2020 0.0  0.0 - 0.2 % Final  . Neutrophils Relative % 09/05/2020 83  % Final  . Neutro Abs 09/05/2020 7.6  1.7 - 7.7 K/uL Final  . Lymphocytes Relative 09/05/2020 11  % Final  . Lymphs Abs 09/05/2020 1.0  0.7 - 4.0 K/uL Final  . Monocytes Relative 09/05/2020 5  % Final  . Monocytes Absolute 09/05/2020 0.5  0.1 - 1.0 K/uL Final  . Eosinophils Relative 09/05/2020 0  % Final  .  Eosinophils Absolute 09/05/2020 0.0  0.0 - 0.5 K/uL Final  . Basophils Relative 09/05/2020 1  % Final  . Basophils Absolute 09/05/2020 0.1  0.0 - 0.1 K/uL Final  . Immature Granulocytes 09/05/2020 0  % Final  . Abs Immature Granulocytes 09/05/2020 0.03  0.00 - 0.07 K/uL Final   Performed at [facility], [address] [place], [place] [address]    ECG: Date: 02/02/2020 Rhythm: sinus bradycardia; LAFB Axis (leads I and aVF): Left ST segment and T wave changes: No evidence of acute ST segment elevation or depression Comparison: Similar to previous tracing obtained on 02/12/2018   IMAGING / PROCEDURES: LEFT HEART CATHETERIZATION AND CORONARY ANGIOGRAPHY performed on 02/16/2018 1. Aneurysmal coronary arteries with diffuse, severe, multivessel atherosclerotic CAD  100% stenosis of the proximal LAD  90% stenosis proximal D1  Large OM1 with serial 90% stenoses  80% stenosis of the mid RPL 2. Significantly elevated LV EDP of 15 mmHg 3. Low normal left ventricular systolic function; LVEF 71% 4. Recommendations:  Aggressive secondary prevention  Initiate rosuvastatin 40 mg daily  Follow-up with primary cardiologist  ECHOCARDIOGRAM STRESS TEST performed on 01/27/2018 1. The resting study shows normal global and segmental left ventricular contraction 2. Stress-induced wall motion abnormality involving the apex, apical septal, apical anterior, and mid anteroseptal segments indicative of ischemia 3. Good exercise capacity with no exercise-induced angina 4. Estimated workload 11.3 METS  LEFT HEART CATHETERIZATION AND CORONARY ANGIOGRAPHY performed on 12/23/2003 1. Multivessel CAD  95% stenosis of the ramus intermedius  50% stenosis of the ramus intermedius  25% stenosis of the proximal LAD  100% stenosis of the mid LAD  95% stenosis of D1 2. Ectatic vessels 3. 3.0 x 28 Taxus DES placed to mid LAD lesion resulting in 0% residual stenosis  ECHOCARDIOGRAM  performed on 05/18/2012 1. Mild left ventricular dysfunction with mild LVH 2. Grade 1 diastolic dysfunction corresponds to E/a reversal 3. No valvular regurgitation 4. No valvular stenosis 5. Small area of distal septal hypokinesis 6. Preserved LVEF of >55%  Impression and Plan:  Darren Page has been referred for pre-anesthesia review and clearance prior to him undergoing the planned anesthetic and procedural courses. Available labs, pertinent testing, and imaging results were personally reviewed by me. This patient has been appropriately cleared by cardiology with an overall MODERATE risk of significant perioperative cardiovascular complications.  Based on clinical review performed today (09/13/20), barring any significant acute changes in the patient's overall condition, it is anticipated that he will be able to proceed with the planned surgical intervention. Any acute changes in clinical condition may necessitate his procedure being postponed and/or cancelled. Patient will meet with anesthesia team (MD and/or CRNA) on this day of his procedure for preoperative evaluation/assessment.   Pre-surgical instructions were reviewed with the patient during his PAT appointment and questions were fielded by PAT clinical staff. Patient was advised that if any questions or concerns arise prior to his procedure then he should return a call to PAT and/or his surgeon's office to discuss.  Honor Loh, MSN, APRN, FNP-C, CEN [facility]  Peri-operative Services Nurse Practitioner Phone: [phone] 09/13/20 1:10 PM  NOTE: This note has been prepared using Dragon dictation software. Despite my best ability to proofread, there is always the potential that unintentional transcriptional errors may still occur from this process.

## 2020-09-14 ENCOUNTER — Ambulatory Visit: Payer: Medicare Other | Admitting: Urgent Care

## 2020-09-14 ENCOUNTER — Ambulatory Visit: Payer: Medicare Other

## 2020-09-14 ENCOUNTER — Encounter
Admission: RE | Disposition: A | Discharge status: Home / Self Care | Payer: Self-pay | Source: Home / Self Care | Attending: Urology

## 2020-09-14 ENCOUNTER — Encounter: Payer: Self-pay | Admitting: Urology

## 2020-09-14 ENCOUNTER — Ambulatory Visit
Admission: RE | Admit: 2020-09-14 | Discharge: 2020-09-14 | Disposition: A | Discharge status: Home / Self Care | Payer: Medicare Other | Attending: Urology | Admitting: Urology

## 2020-09-14 DIAGNOSIS — Q632 Ectopic kidney: Secondary | ICD-10-CM | POA: Diagnosis not present

## 2020-09-14 DIAGNOSIS — N1339 Other hydronephrosis: Secondary | ICD-10-CM | POA: Diagnosis not present

## 2020-09-14 DIAGNOSIS — I719 Aortic aneurysm of unspecified site, without rupture: Secondary | ICD-10-CM | POA: Insufficient documentation

## 2020-09-14 DIAGNOSIS — N201 Calculus of ureter: Secondary | ICD-10-CM | POA: Diagnosis not present

## 2020-09-14 DIAGNOSIS — Z79899 Other long term (current) drug therapy: Secondary | ICD-10-CM | POA: Insufficient documentation

## 2020-09-14 DIAGNOSIS — E785 Hyperlipidemia, unspecified: Secondary | ICD-10-CM | POA: Diagnosis not present

## 2020-09-14 DIAGNOSIS — N200 Calculus of kidney: Secondary | ICD-10-CM

## 2020-09-14 DIAGNOSIS — I10 Essential (primary) hypertension: Secondary | ICD-10-CM | POA: Insufficient documentation

## 2020-09-14 DIAGNOSIS — Z7982 Long term (current) use of aspirin: Secondary | ICD-10-CM | POA: Insufficient documentation

## 2020-09-14 DIAGNOSIS — I2510 Atherosclerotic heart disease of native coronary artery without angina pectoris: Secondary | ICD-10-CM | POA: Diagnosis not present

## 2020-09-14 DIAGNOSIS — Z955 Presence of coronary angioplasty implant and graft: Secondary | ICD-10-CM | POA: Insufficient documentation

## 2020-09-14 DIAGNOSIS — I252 Old myocardial infarction: Secondary | ICD-10-CM | POA: Diagnosis not present

## 2020-09-14 DIAGNOSIS — N132 Hydronephrosis with renal and ureteral calculous obstruction: Secondary | ICD-10-CM | POA: Insufficient documentation

## 2020-09-14 DIAGNOSIS — Z85828 Personal history of other malignant neoplasm of skin: Secondary | ICD-10-CM | POA: Insufficient documentation

## 2020-09-14 DIAGNOSIS — I723 Aneurysm of iliac artery: Secondary | ICD-10-CM | POA: Insufficient documentation

## 2020-09-14 DIAGNOSIS — J449 Chronic obstructive pulmonary disease, unspecified: Secondary | ICD-10-CM | POA: Diagnosis not present

## 2020-09-14 DIAGNOSIS — Z87891 Personal history of nicotine dependence: Secondary | ICD-10-CM | POA: Insufficient documentation

## 2020-09-14 HISTORY — PX: CYSTOSCOPY/URETEROSCOPY/HOLMIUM LASER/STENT PLACEMENT: SHX6546

## 2020-09-14 SURGERY — CYSTOSCOPY/URETEROSCOPY/HOLMIUM LASER/STENT PLACEMENT
Anesthesia: General | Laterality: Right

## 2020-09-14 MED ORDER — LIDOCAINE HCL (CARDIAC) PF 100 MG/5ML IV SOSY
PREFILLED_SYRINGE | INTRAVENOUS | Status: DC | PRN
  Administered 2020-09-14: 80 mg via INTRAVENOUS

## 2020-09-14 MED ORDER — HYDROCODONE-ACETAMINOPHEN 5-325 MG PO TABS: 1.0000 | ORAL_TABLET | ORAL | 0 refills | Status: AC | PRN

## 2020-09-14 MED ORDER — ROCURONIUM BROMIDE 100 MG/10ML IV SOLN
INTRAVENOUS | Status: DC | PRN
  Administered 2020-09-14: 40 mg via INTRAVENOUS

## 2020-09-14 MED ORDER — FENTANYL CITRATE (PF) 100 MCG/2ML IJ SOLN
INTRAMUSCULAR | Status: AC
  Filled 2020-09-14: qty 2

## 2020-09-14 MED ORDER — ONDANSETRON HCL 4 MG/2ML IJ SOLN
INTRAMUSCULAR | Status: AC
  Filled 2020-09-14: qty 2

## 2020-09-14 MED ORDER — KETOROLAC TROMETHAMINE 30 MG/ML IJ SOLN
INTRAMUSCULAR | Status: AC
  Filled 2020-09-14: qty 1

## 2020-09-14 MED ORDER — OXYCODONE HCL 5 MG PO TABS
5.0000 mg | ORAL_TABLET | Freq: Once | ORAL | Status: DC | PRN
Start: 2020-09-14 — End: 2020-09-14

## 2020-09-14 MED ORDER — LIDOCAINE HCL (PF) 2 % IJ SOLN
INTRAMUSCULAR | Status: AC
  Filled 2020-09-14: qty 5

## 2020-09-14 MED ORDER — SUGAMMADEX SODIUM 200 MG/2ML IV SOLN
INTRAVENOUS | Status: DC | PRN
  Administered 2020-09-14 (×2): 100 mg via INTRAVENOUS

## 2020-09-14 MED ORDER — CEFAZOLIN SODIUM-DEXTROSE 2-4 GM/100ML-% IV SOLN
INTRAVENOUS | Status: AC
  Filled 2020-09-14: qty 100

## 2020-09-14 MED ORDER — OXYCODONE HCL 5 MG/5ML PO SOLN: 5.0000 mg | Freq: Once | ORAL | Status: DC | PRN

## 2020-09-14 MED ORDER — ONDANSETRON HCL 4 MG/2ML IJ SOLN: 4.0000 mg | Freq: Once | INTRAMUSCULAR | Status: DC | PRN

## 2020-09-14 MED ORDER — ORAL CARE MOUTH RINSE: 15.0000 mL | Freq: Once | OROMUCOSAL | Status: AC

## 2020-09-14 MED ORDER — KETOROLAC TROMETHAMINE 30 MG/ML IJ SOLN
INTRAMUSCULAR | Status: DC | PRN
  Administered 2020-09-14: 15 mg via INTRAVENOUS

## 2020-09-14 MED ORDER — BELLADONNA ALKALOIDS-OPIUM 16.2-60 MG RE SUPP
RECTAL | Status: AC
  Filled 2020-09-14: qty 1

## 2020-09-14 MED ORDER — CEFAZOLIN SODIUM-DEXTROSE 2-4 GM/100ML-% IV SOLN
2.0000 g | INTRAVENOUS | Status: AC
  Administered 2020-09-14: 2 g via INTRAVENOUS

## 2020-09-14 MED ORDER — CHLORHEXIDINE GLUCONATE 0.12 % MT SOLN: 15.0000 mL | Freq: Once | OROMUCOSAL | Status: AC

## 2020-09-14 MED ORDER — DEXAMETHASONE SODIUM PHOSPHATE 10 MG/ML IJ SOLN
INTRAMUSCULAR | Status: DC | PRN
  Administered 2020-09-14: 5 mg via INTRAVENOUS

## 2020-09-14 MED ORDER — SULFAMETHOXAZOLE-TRIMETHOPRIM 800-160 MG PO TABS: 1.0000 | ORAL_TABLET | Freq: Every day | ORAL | 0 refills | Status: DC

## 2020-09-14 MED ORDER — PROPOFOL 10 MG/ML IV BOLUS
INTRAVENOUS | Status: DC | PRN
  Administered 2020-09-14: 160 mg via INTRAVENOUS

## 2020-09-14 MED ORDER — OXYBUTYNIN CHLORIDE ER 10 MG PO TB24: 10.0000 mg | ORAL_TABLET | Freq: Every day | ORAL | 0 refills | Status: AC | PRN

## 2020-09-14 MED ORDER — TAMSULOSIN HCL 0.4 MG PO CAPS: 0.4000 mg | ORAL_CAPSULE | Freq: Every day | ORAL | 1 refills | Status: AC

## 2020-09-14 MED ORDER — PROPOFOL 10 MG/ML IV BOLUS
INTRAVENOUS | Status: AC
  Filled 2020-09-14: qty 20

## 2020-09-14 MED ORDER — ROCURONIUM BROMIDE 10 MG/ML (PF) SYRINGE
PREFILLED_SYRINGE | INTRAVENOUS | Status: AC
  Filled 2020-09-14: qty 10

## 2020-09-14 MED ORDER — DEXAMETHASONE SODIUM PHOSPHATE 10 MG/ML IJ SOLN
INTRAMUSCULAR | Status: AC
  Filled 2020-09-14: qty 1

## 2020-09-14 MED ORDER — LACTATED RINGERS IV SOLN: INTRAVENOUS | Status: DC

## 2020-09-14 MED ORDER — CHLORHEXIDINE GLUCONATE 0.12 % MT SOLN
OROMUCOSAL | Status: AC
  Administered 2020-09-14: 15 mL via OROMUCOSAL
  Filled 2020-09-14: qty 15

## 2020-09-14 MED ORDER — FENTANYL CITRATE (PF) 100 MCG/2ML IJ SOLN
INTRAMUSCULAR | Status: DC | PRN
  Administered 2020-09-14 (×2): 50 ug via INTRAVENOUS

## 2020-09-14 MED ORDER — FENTANYL CITRATE (PF) 100 MCG/2ML IJ SOLN: 25.0000 ug | INTRAMUSCULAR | Status: DC | PRN

## 2020-09-14 MED ORDER — IOHEXOL 180 MG/ML  SOLN
INTRAMUSCULAR | Status: DC | PRN
  Administered 2020-09-14: 16 mL

## 2020-09-14 MED ORDER — EPHEDRINE SULFATE 50 MG/ML IJ SOLN
INTRAMUSCULAR | Status: DC | PRN
  Administered 2020-09-14: 10 mg via INTRAVENOUS

## 2020-09-14 SURGICAL SUPPLY — 29 items
BAG DRAIN CYSTO-URO LG1000N (MISCELLANEOUS) ×2 IMPLANT
BASKET ZERO TIP 1.9FR (BASKET) ×2 IMPLANT
BRUSH SCRUB EZ 1% IODOPHOR (MISCELLANEOUS) ×2 IMPLANT
CATH URET FLEX-TIP 2 LUMEN 10F (CATHETERS) IMPLANT
CATH URETL 5X70 OPEN END (CATHETERS) IMPLANT
CNTNR SPEC 2.5X3XGRAD LEK (MISCELLANEOUS) ×1
CONT SPEC 4OZ STER OR WHT (MISCELLANEOUS) ×1
CONTAINER SPEC 2.5X3XGRAD LEK (MISCELLANEOUS) ×1 IMPLANT
DRAPE UTILITY 15X26 TOWEL STRL (DRAPES) ×2 IMPLANT
DRSG TEGADERM 2-3/8X2-3/4 SM (GAUZE/BANDAGES/DRESSINGS) ×2 IMPLANT
GLOVE SURG UNDER POLY LF SZ7.5 (GLOVE) ×2 IMPLANT
GOWN STRL REUS W/ TWL LRG LVL3 (GOWN DISPOSABLE) ×1 IMPLANT
GOWN STRL REUS W/ TWL XL LVL3 (GOWN DISPOSABLE) ×1 IMPLANT
GOWN STRL REUS W/TWL LRG LVL3 (GOWN DISPOSABLE) ×1
GOWN STRL REUS W/TWL XL LVL3 (GOWN DISPOSABLE) ×1
GUIDEWIRE STR DUAL SENSOR (WIRE) ×2 IMPLANT
INFUSOR MANOMETER BAG 3000ML (MISCELLANEOUS) ×2 IMPLANT
IV NS IRRIG 3000ML ARTHROMATIC (IV SOLUTION) ×2 IMPLANT
KIT TURNOVER CYSTO (KITS) ×2 IMPLANT
PACK CYSTO AR (MISCELLANEOUS) ×2 IMPLANT
SET CYSTO W/LG BORE CLAMP LF (SET/KITS/TRAYS/PACK) ×2 IMPLANT
SHEATH URETERAL 12FRX35CM (MISCELLANEOUS) IMPLANT
STENT URET 6FRX24 CONTOUR (STENTS) IMPLANT
STENT URET 6FRX26 CONTOUR (STENTS) IMPLANT
SURGILUBE 2OZ TUBE FLIPTOP (MISCELLANEOUS) ×2 IMPLANT
SYR 10ML LL (SYRINGE) ×2 IMPLANT
TRACTIP FLEXIVA PULSE ID 200 (Laser) ×2 IMPLANT
VALVE UROSEAL ADJ ENDO (VALVE) IMPLANT
WATER STERILE IRR 1000ML POUR (IV SOLUTION) ×2 IMPLANT

## 2020-09-14 NOTE — Interval H&P Note (Signed)
UROLOGY H&P UPDATE  Agree with prior H&P dated 09/11/20. 79mm right distal ureteral stone in pelvic kidney.  Cardiac: RRR Lungs: CTA bilaterally  Laterality: right Procedure: right Ureteroscopy, laser lithotripsy, stent placement  Urine: culture 4/12 no growth  We specifically discussed the risks ureteroscopy including bleeding, infection/sepsis, stent related symptoms including flank pain/urgency/frequency/incontinence/dysuria, ureteral injury, inability to access stone, or need for staged or additional procedures.   [name], MD 09/14/2020

## 2020-09-14 NOTE — Op Note (Signed)
Date of procedure: 09/14/20  Preoperative diagnosis:  1. Right distal ureteral stone  Postoperative diagnosis:  1. Same  Procedure: 1. Cystoscopy, right ureteroscopy, laser lithotripsy, stent placement, right retrograde pyelogram with intraoperative interpretation  Surgeon: Nickolas Madrid, MD  Anesthesia: General  Complications: None  Intraoperative findings:  1.  Moderate size prostate, normal cystoscopy, ureteral orifices orthotopic bilaterally 2.  Impacted right distal ureteral stone fragmented and basket extracted 3.  Right pelvic kidney, stent curl in upper pole, renal pelvis, and bladder  EBL: Minimal  Specimens: Stone for analysis  Drains: Right 4.8 French by 20 cm stent  Indication: Quame Spratlin is a 65 y.o. patient with a right-sided pelvic kidney with a large 8 mm right distal ureteral stone and upstream hydronephrosis.  After reviewing the management options for treatment, they elected to proceed with the above surgical procedure(s). We have discussed the potential benefits and risks of the procedure, side effects of the proposed treatment, the likelihood of the patient achieving the goals of the procedure, and any potential problems that might occur during the procedure or recuperation. Informed consent has been obtained.  Description of procedure:  The patient was taken to the operating room and general anesthesia was induced. SCDs were placed for DVT prophylaxis. The patient was placed in the dorsal lithotomy position, prepped and draped in the usual sterile fashion, and preoperative antibiotics were administered. A preoperative time-out was performed.   67 French rigid cystoscope was used to intubate the urethra and a normal-appearing urethra was followed proximally to the bladder.  The prostate was moderate in size.  Ureteral orifices were orthotopic bilaterally.  Thorough cystoscopy showed no suspicious lesions.  I advanced a sensor wire alongside the  stone up to the pelvic kidney upper pole under fluoroscopic vision.  Using the aid of a second sensor wire I was able to advance the semirigid ureteroscope into the right distal ureter and there was an impacted black ureteral stone just inside the orifice.  This was fragmented using a 242 m laser fiber on settings of 1.0 J and 10 Hz.  The fragments were then all basket extracted into the bladder.  Thorough ureteroscopy revealed no other significant stone fragments or injury.  A retrograde pyelogram showed no extravasation and mild hydronephrosis of the pelvic kidney.  The rigid cystoscope was backloaded over the wire and a 4.8 [place] by 20 cm stent was placed in the pelvic kidney.  There was a curl in the upper pole, renal pelvis, as well as in the bladder.  The bladder was drained and this concluded our procedure.  A belladonna suppository was placed.  Disposition: Stable to PACU  Plan: Stent removal in 1 to 2 weeks Bactrim prophylaxis with stent in place  Nickolas Madrid, MD

## 2020-09-14 NOTE — Transfer of Care (Signed)
Immediate Anesthesia Transfer of Care Note  Patient: Darren Page  Procedure(s) Performed: CYSTOSCOPY/URETEROSCOPY/HOLMIUM LASER/STENT PLACEMENT (Right )  Patient Location: PACU  Anesthesia Type:General  Level of Consciousness: awake  Airway & Oxygen Therapy: Patient connected to face mask oxygen  Post-op Assessment: Report given to RN  Post vital signs: stable  Last Vitals:  Vitals Value Taken Time  BP 93/57 09/14/20 1445  Temp    Pulse 52 09/14/20 1449  Resp 12 09/14/20 1449  SpO2 99 % 09/14/20 1449  Vitals shown include unvalidated device data.  Last Pain:  Vitals:   09/14/20 1229  TempSrc: Temporal  PainSc: 0-No pain         Complications: No complications documented.

## 2020-09-14 NOTE — Discharge Instructions (Signed)

## 2020-09-14 NOTE — Anesthesia Preprocedure Evaluation (Addendum)
Anesthesia Evaluation  Patient identified by MRN, date of birth, ID band Patient awake    Reviewed: Allergy & Precautions, H&P , NPO status , Patient's Chart, lab work & pertinent test results  History of Anesthesia Complications Negative for: history of anesthetic complications  Airway Mallampati: II  TM Distance: >3 FB Neck ROM: full    Dental  (+) Teeth Intact   Pulmonary neg sleep apnea, COPD, Patient abstained from smoking., former smoker,    breath sounds clear to auscultation       Cardiovascular hypertension, (-) angina+ CAD, + Past MI and + Cardiac Stents  (-) dysrhythmias  Rhythm:regular Rate:Normal     Neuro/Psych negative neurological ROS  negative psych ROS   GI/Hepatic negative GI ROS, Neg liver ROS,   Endo/Other  negative endocrine ROS  Renal/GU      Musculoskeletal   Abdominal   Peds  Hematology negative hematology ROS (+)   Anesthesia Other Findings Past Medical History: No date: Aneurysm of aorta (HCC) No date: COPD (chronic obstructive pulmonary disease) (HCC) No date: Coronary artery disease 12/2003: History of acute anterior wall MI No date: History of kidney stones 07/19/2017: History of sebaceous cyst     Comment:  Left shoulder No date: Hyperlipidemia No date: Hypertension  Past Surgical History: 08/23/2019: COLONOSCOPY WITH PROPOFOL; N/A     Comment:  Procedure: COLONOSCOPY WITH PROPOFOL;  Surgeon: Lucilla Lame, MD;  Location: [facility] ENDOSCOPY;  Service:               Endoscopy;  Laterality: N/A;  priority 3 No date: SKIN CANCER EXCISION     Comment:  mohs nose  BMI    Body Mass Index: 26.23 kg/m      Reproductive/Obstetrics negative OB ROS                           Anesthesia Physical Anesthesia Plan  ASA: III  Anesthesia Plan: General ETT   Post-op Pain Management:    Induction:   PONV Risk Score and Plan: Ondansetron,  Dexamethasone and Treatment may vary due to age or medical condition  Airway Management Planned:   Additional Equipment:   Intra-op Plan:   Post-operative Plan:   Informed Consent: I have reviewed the patients History and Physical, chart, labs and discussed the procedure including the risks, benefits and alternatives for the proposed anesthesia with the patient or authorized representative who has indicated his/her understanding and acceptance.     Dental Advisory Given  Plan Discussed with: Anesthesiologist, CRNA and Surgeon  Anesthesia Plan Comments:         Anesthesia Quick Evaluation

## 2020-09-14 NOTE — Anesthesia Procedure Notes (Signed)
Procedure Name: Intubation Date/Time: 09/14/2020 1:50 PM Performed by: Zetta Bills, CRNA Pre-anesthesia Checklist: Patient identified, Patient being monitored, Timeout performed, Emergency Drugs available and Suction available Patient Re-evaluated:Patient Re-evaluated prior to induction Oxygen Delivery Method: Circle system utilized Preoxygenation: Pre-oxygenation with 100% oxygen Induction Type: IV induction Ventilation: Mask ventilation without difficulty Laryngoscope Size: 3 and McGraph Grade View: Grade I Tube type: Oral Tube size: 7.0 mm Number of attempts: 1 Airway Equipment and Method: Stylet Placement Confirmation: ETT inserted through vocal cords under direct vision,  positive ETCO2 and breath sounds checked- equal and bilateral Secured at: 21 cm Tube secured with: Tape Dental Injury: Teeth and Oropharynx as per pre-operative assessment

## 2020-09-14 NOTE — Anesthesia Postprocedure Evaluation (Signed)
Anesthesia Post Note  Patient: Darren Page  Procedure(s) Performed: CYSTOSCOPY/URETEROSCOPY/HOLMIUM LASER/STENT PLACEMENT (Right )  Patient location during evaluation: PACU Anesthesia Type: General Level of consciousness: awake and alert Pain management: pain level controlled Vital Signs Assessment: post-procedure vital signs reviewed and stable Respiratory status: spontaneous breathing, nonlabored ventilation and respiratory function stable Cardiovascular status: blood pressure returned to baseline and stable Postop Assessment: no apparent nausea or vomiting Comments:   I had a conversation with the patient in PACU about possible awareness under anesthesia.  When asked if he remembered anything from the procedure, he reports "maybe hearing some sounds" but can't remember what exactly he heard.  When asked about discomfort, he says "maybe a little."  When asked further what he felt during the procedure and whether he experienced any pain, he states "I can't really remember it."  When asked if he was experiencing any distress he states that "no, everything is fine."    No complications documented.   Last Vitals:  Vitals:   09/14/20 1500 09/14/20 1515  BP: 116/74 112/79  Pulse: (!) 57 (!) 57  Resp: 11 12  Temp:    SpO2: 100% 97%    Last Pain:  Vitals:   09/14/20 1445  TempSrc:   PainSc: Asleep                 Tera Mater

## 2020-09-15 ENCOUNTER — Encounter: Payer: Self-pay | Admitting: Urology

## 2020-09-19 LAB — CALCULI, WITH PHOTOGRAPH (CLINICAL LAB)
Calcium Oxalate Dihydrate: 30 %
Calcium Oxalate Monohydrate: 70 %
Weight Calculi: 14 mg

## 2020-09-27 ENCOUNTER — Telehealth: Payer: Self-pay

## 2020-09-27 ENCOUNTER — Encounter: Payer: Self-pay | Admitting: Urology

## 2020-09-27 ENCOUNTER — Ambulatory Visit (INDEPENDENT_AMBULATORY_CARE_PROVIDER_SITE_OTHER): Payer: Medicare Other | Admitting: Urology

## 2020-09-27 ENCOUNTER — Other Ambulatory Visit: Payer: Self-pay

## 2020-09-27 VITALS — BP 118/76 | HR 46 | Ht 71.0 in | Wt 186.0 lb

## 2020-09-27 DIAGNOSIS — N200 Calculus of kidney: Secondary | ICD-10-CM | POA: Diagnosis not present

## 2020-09-27 NOTE — Telephone Encounter (Signed)
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 Patient states that he is not interested in seeing vascular and does not want to have surgery for this. It was again expressed that we would encourage him to reconsider and the importance of speaking with them in regards to the aneurysms. He states he will think about it but does not want to do anything at this time.

## 2020-09-27 NOTE — Patient Instructions (Signed)

## 2020-09-27 NOTE — Telephone Encounter (Signed)
-----   Message from [name], MD sent at 09/27/2020 12:54 PM EDT ----- Regarding: FW: bilateral iliac anuerysms Can you reach out to him and encourage follow-up with vascular surgery.  These are 2 different issues.  His kidney stone has been treated and we are ordering a kidney ultrasound to confirm healing after the stone was stuck.    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  Nickolas Madrid, MD 09/27/2020     ----- Message ----- From: Algernon Huxley, MD Sent: 09/27/2020  12:52 PM EDT To: [name], MD, Tiburcio Bash Subject: RE: bilateral iliac anuerysms                  OK.  Adding Dr. Diamantina Providence to this so he knows we are trying to get him in.  ----- Message ----- From: Tiburcio Bash Sent: 09/27/2020  12:33 PM EDT To: Algernon Huxley, MD Subject: RE: bilateral iliac anuerysms                  Good afternoon, I spoke with the patient on 09/21/20 and he stated that he wanted to talk with Dr. Dorette Grate before he scheduled. Just hung up with him now and he states that he wants to get results of the [place] before he decides what to do. I explained that is the reason for coming here, having [place] and getting results from Dr. Lucky Cowboy. He states that he will call us back to schedule. He needs to think about it.   Thanks, Lavella Lemons ----- Message ----- From: Algernon Huxley, MD Sent: 09/27/2020  11:56 AM EDT To: Tiburcio Bash Subject: FW: bilateral iliac anuerysms                  Lets try to get this fellow in with any of [place] in the next week or so please Corene Cornea ----- Message ----- From: [name], MD Sent: 09/27/2020  11:01 AM EDT To: Algernon Huxley, MD Subject: bilateral iliac anuerysms                      I placed the referral on 4/13 but he still has not heard anything, can you facilitate scheduling on your end? Thanks!  Aaron Edelman

## 2020-09-27 NOTE — Progress Notes (Signed)
Cystoscopy Procedure Note:  Indication: Stent removal s/p right ureteroscopy, laser lithotripsy, stent placement and right pelvic kidney for impacted 8 mm distal ureteral stone  After informed consent and discussion of the procedure and its risks, Darren Page was positioned and prepped in the standard fashion. Cystoscopy was performed with a flexible cystoscope. The stent was grasped with flexible graspers and removed in its entirety. The patient tolerated the procedure well.  Findings: Uncomplicated stent removal  Assessment and Plan: Follow up in 4 weeks with renal ultrasound to evaluate for silent hydronephrosis Messaged Dr. Lucky Cowboy with vascular surgery regarding bilateral iliac aneurysms and follow-up  [name], MD 09/27/2020

## 2020-09-28 LAB — URINALYSIS, COMPLETE
Bilirubin, UA: NEGATIVE
Glucose, UA: NEGATIVE
Ketones, UA: NEGATIVE
Nitrite, UA: NEGATIVE
Specific Gravity, UA: 1.01 (ref 1.005–1.030)
Urobilinogen, Ur: 0.2 mg/dL (ref 0.2–1.0)
pH, UA: 5 (ref 5.0–7.5)

## 2020-09-28 LAB — MICROSCOPIC EXAMINATION
Bacteria, UA: NONE SEEN
RBC, Urine: >30 /hpf — AB (ref 0–2)

## 2020-11-28 ENCOUNTER — Ambulatory Visit: Payer: Medicare Other | Admitting: Dermatology

## 2021-02-26 ENCOUNTER — Encounter: Payer: Self-pay | Admitting: Family Medicine

## 2021-02-26 ENCOUNTER — Other Ambulatory Visit: Payer: Self-pay

## 2021-02-26 ENCOUNTER — Ambulatory Visit: Payer: Self-pay

## 2021-02-26 ENCOUNTER — Telehealth: Payer: Medicare Other | Admitting: Family Medicine

## 2021-02-26 ENCOUNTER — Ambulatory Visit (INDEPENDENT_AMBULATORY_CARE_PROVIDER_SITE_OTHER): Payer: Medicare Other | Admitting: Family Medicine

## 2021-02-26 ENCOUNTER — Telehealth: Payer: Self-pay

## 2021-02-26 VITALS — BP 138/80 | HR 72 | Ht 71.0 in | Wt 192.0 lb

## 2021-02-26 DIAGNOSIS — E860 Dehydration: Secondary | ICD-10-CM | POA: Diagnosis not present

## 2021-02-26 DIAGNOSIS — R197 Diarrhea, unspecified: Secondary | ICD-10-CM

## 2021-02-26 DIAGNOSIS — A020 Salmonella enteritis: Secondary | ICD-10-CM | POA: Diagnosis not present

## 2021-02-26 MED ORDER — AZITHROMYCIN 250 MG PO TABS: ORAL_TABLET | ORAL | 0 refills | Status: AC

## 2021-02-26 NOTE — Patient Instructions (Signed)
Salmonella Gastroenteritis, Adult Salmonella gastroenteritis is an infection of the intestines. It can cause nausea, vomiting, and other symptoms. Fever usually lasts for 2-3 days, and diarrhea lasts 4-10 days. Most people recover completely, but some people may develop lasting problems, such as arthritis, irritation of the eyes, or painful urination. What are the causes? This condition is caused by salmonella bacteria. These bacteria can spread through food that is not cooked properly, contact with animals that have the bacteria, or contact with a person's stool. You can get this infection by: Eating food or drinking liquids that have the bacteria. Drinking polluted standing water. Coming into contact with an animal that is carrying the bacteria, such as a turtle, bird, snake, or iguana. What increases the risk? This condition is more likely to develop in: Elderly adults. People with a weakened disease-fighting system (immune system). People with poor personal or kitchen hygiene. People who have contact with animals that are known to carry the bacteria. What are the signs or symptoms? Symptoms of this condition include: Diarrhea, which may be bloody. Abdominal pain or cramps. Fever. Chills. Nausea. Vomiting. Headache. How is this diagnosed? This condition may be diagnosed based on: Your symptoms. Your medical history. A physical exam. A blood test. A stool test. How is this treated? This condition may be managed by: Drinking plenty of fluids. This is important because this infection can make you lose a lot of fluid (dehydrated). Taking antibiotic medicines. These may be given if your condition is severe. Medicines may help shorten your illness. Follow these instructions at home: Medicines Take over-the-counter and prescription medicines only as told by your health care provider. Do not take medicines to help with diarrhea. These medicines can make the infection worse. If you  were prescribed an antibiotic medicine, take it as told by your health care provider. Do not stop taking the antibiotic even if you start to feel better. Eating and drinking   Drink enough fluid to keep your urine pale yellow. This helps prevent dehydration. Drink only clear liquids until your diarrhea, nausea, or vomiting is under control. Clear liquids are liquids you can see through, such as water, broth, fruit juice with added water (diluted fruit juice), low-calorie sports drinks, or non-caffeinated tea. Take an oral rehydration solution (ORS) as told by your health care provider. This drink is sold at pharmacies and retail stores. If you are not hungry, do not force yourself to eat. Eat bland, easy-to-digest foods in small amounts as you are able. These foods include bananas, applesauce, rice, lean meats, toast and crackers. Avoid: Fluids that contain a lot of sugar or caffeine, such as energy drinks, high-calorie sports drinks, and soda. Alcohol. Foods that are greasy or contain a lot of fat or sugar. Spicy foods. Do not prepare food for others if you have diarrhea. Food safety  Use separate food preparation surfaces and storage spaces for raw meat and for fruits and vegetables. Keep refrigerated foods colder than 63F (5C). Serve hot foods immediately or keep them heated above 163F (60C). Always cook meat, eggs, seafood, and poultry thoroughly. Do not eat or drink unpasteurized dairy products. Wash your hands thoroughly after handling or preparing meat, eggs, seafood, and poultry. Wash your hands, food preparation surfaces, and utensils thoroughly before and after you handle raw foods. Wash your hands thoroughly before eating. General instructions Wash your hands often with soap and water. If soap and water are not available, use hand sanitizer. This helps keep the bacteria from spreading to others.  Keep track of changes in your weight. Losing a lot of weight can be a sign of a  serious problem. Ask your health care provider how much weight loss should concern you. Stay home from work or school as told by your health care provider. Keep all follow-up visits as told by your health care provider. This is important. Contact a health care provider if you: Have a fever. Have diarrhea that has blood or mucus in it. Feel weak or dizzy. Have a headache. Have urinated only a small amount of very dark urine over 6-8 hours. Have weight loss. Have redness, irritation, or pain in your eyes. Have pain when urinating. Have swelling or a feeling of warmth in a joint. Get help right away if you: Cannot keep fluids down. Cannot stop vomiting or having diarrhea. Have pain in the abdomen, and the pain gets worse. Have any symptoms of severe dehydration. These include: Not urinating in 6-8 hours. Cold and clammy skin. Extreme thirst. Confusion. Rapid breathing. Difficulty waking from sleep. Have changes in vision or loss of vision. Summary Salmonella gastroenteritis is an infection of the intestines. It can cause nausea, vomiting, and other symptoms. It is caused by salmonella bacteria. People can get this condition by eating foods or drinking liquids that have the bacteria, or by coming into contact with an animal that is carrying the bacteria. People with the condition need to drink plenty of fluids to avoid dehydration. Treatment for a severe case may include antibiotic medicines. Follow your health care provider's instructions for taking medicines, eating and drinking, handling food in a safe way, and calling for help. This information is not intended to replace advice given to you by your health care provider. Make sure you discuss any questions you have with your health care provider. Document Revised: 06/29/2018 Document Reviewed: 07/02/2017 Elsevier Patient Education  2022 [employer].

## 2021-02-26 NOTE — Telephone Encounter (Signed)
Spoke to pt- he will come in this afternoon

## 2021-02-26 NOTE — Progress Notes (Signed)
Date:  02/26/2021   Name:  Darren Page   DOB:  29-Jun-1955   MRN:  [id number]   Chief Complaint: Diarrhea (Symptoms started Friday of last week- wife's test came back positive for salmonella last night. Diarrhea approx 12 times a day, abd. pain )  Diarrhea  This is a new problem. The current episode started in the past 7 days. The problem occurs more than 10 times per day. The problem has been gradually worsening. The stool consistency is described as Watery. The patient states that diarrhea awakens him from sleep. Associated symptoms include abdominal pain, chills and increased flatus. Pertinent negatives include no bloating, coughing, fever, headaches, myalgias, sweats, URI, vomiting or weight loss. Risk factors include suspect food intake. He has tried increased fluids for the symptoms. The treatment provided mild relief.   Lab Results  Component Value Date   CREATININE 1.32 (H) 09/05/2020   BUN 29 (H) 09/05/2020   NA 137 09/05/2020   K 4.5 09/05/2020   CL 104 09/05/2020   CO2 26 09/05/2020   No results found for: CHOL, HDL, LDLCALC, LDLDIRECT, TRIG, CHOLHDL No results found for: TSH No results found for: HGBA1C Lab Results  Component Value Date   WBC 9.1 09/05/2020   HGB 17.9 (H) 09/05/2020   HCT 52.8 (H) 09/05/2020   MCV 94.8 09/05/2020   PLT 239 09/05/2020   Lab Results  Component Value Date   ALT 23 09/05/2020   AST 29 09/05/2020   ALKPHOS 68 09/05/2020   BILITOT 0.5 09/05/2020     Review of Systems  Constitutional:  Positive for chills, diaphoresis and unexpected weight change. Negative for fever and weight loss.  HENT:  Negative for drooling, ear discharge, ear pain and sore throat.   Respiratory:  Negative for cough, shortness of breath and wheezing.   Cardiovascular:  Negative for chest pain, palpitations and leg swelling.  Gastrointestinal:  Positive for abdominal pain, diarrhea and flatus. Negative for bloating, blood in stool, constipation,  nausea and vomiting.  Endocrine: Negative for polydipsia.  Genitourinary:  Negative for dysuria, frequency, hematuria and urgency.  Musculoskeletal:  Negative for back pain, myalgias and neck pain.  Skin:  Negative for rash.  Allergic/Immunologic: Negative for environmental allergies.  Neurological:  Negative for dizziness and headaches.  Hematological:  Does not bruise/bleed easily.  Psychiatric/Behavioral:  Negative for suicidal ideas. The patient is not nervous/anxious.    Patient Active Problem List   Diagnosis Date Noted   Positive fecal immunochemical test    Polyp of transverse colon    Coronary artery disease 11/22/2012   Hypercholesterolemia 11/22/2012   Tobacco use disorder 11/22/2012    Allergies  Allergen Reactions   Ezetimibe     Other reaction(s): Muscle Pain & joint pain   Atorvastatin Rash    myalgias    Past Surgical History:  Procedure Laterality Date   COLONOSCOPY WITH PROPOFOL N/A 08/23/2019   Procedure: COLONOSCOPY WITH PROPOFOL;  Surgeon: Lucilla Lame, MD;  Location: [facility] ENDOSCOPY;  Service: Endoscopy;  Laterality: N/A;  priority 3   CYSTOSCOPY/URETEROSCOPY/HOLMIUM LASER/STENT PLACEMENT Right 09/14/2020   Procedure: CYSTOSCOPY/URETEROSCOPY/HOLMIUM LASER/STENT PLACEMENT;  Surgeon: [name], MD;  Location: [facility] ORS;  Service: Urology;  Laterality: Right;   SKIN CANCER EXCISION     mohs nose    Social History   Tobacco Use   Smoking status: Former    Packs/day: 1.00    Years: 50.00    Pack years: 50.00    Types: Cigarettes  Quit date: 02/03/2018    Years since quitting: 3.0   Smokeless tobacco: Never   Tobacco comments:    start back on patches  Vaping Use   Vaping Use: Never used  Substance Use Topics   Alcohol use: Yes    Alcohol/week: 1.0 standard drink    Types: 1 Cans of beer per week    Comment: occasionally    Drug use: No     Medication list has been reviewed and updated.  Current Meds  Medication Sig   acetaminophen  (TYLENOL) 500 MG tablet Take 1,000 mg by mouth every 8 (eight) hours as needed for moderate pain.   aspirin 81 MG chewable tablet Chew 81 mg by mouth daily.   cholecalciferol (VITAMIN D3) 25 MCG (1000 UNIT) tablet Take 1,000 Units by mouth daily.   metoprolol succinate (TOPROL-XL) 25 MG 24 hr tablet Take 25 mg by mouth daily.   nitroGLYCERIN (NITROSTAT) 0.4 MG SL tablet Place 0.4 mg under the tongue every 5 (five) minutes as needed for chest pain.   Omega-3 1000 MG CAPS Take 1,000 mg by mouth daily.   rosuvastatin (CRESTOR) 5 MG tablet Take 5 mg by mouth every other day.   [DISCONTINUED] HYDROcodone-acetaminophen (NORCO) 5-325 MG tablet Take 1-2 tablets by mouth every 6 (six) hours as needed for moderate pain.    PHQ 2/9 Scores 02/26/2021 07/12/2019 01/15/2017  PHQ - 2 Score 0 0 0  PHQ- 9 Score 0 1 0    GAD 7 : Generalized Anxiety Score 02/26/2021 07/12/2019  Nervous, Anxious, on Edge 0 0  Control/stop worrying 0 0  Worry too much - different things 0 0  Trouble relaxing 0 0  Restless 0 0  Easily annoyed or irritable 0 1  Afraid - awful might happen 0 1  Total GAD 7 Score 0 2  Anxiety Difficulty - Not difficult at all    BP Readings from Last 3 Encounters:  02/26/21 138/80  09/27/20 118/76  09/14/20 114/74    Physical Exam Vitals and nursing note reviewed.  HENT:     Head: Normocephalic.     Jaw: There is normal jaw occlusion.     Right Ear: Tympanic membrane, ear canal and external ear normal. There is no impacted cerumen.     Left Ear: Tympanic membrane, ear canal and external ear normal. There is no impacted cerumen.     Nose: Nose normal. No congestion or rhinorrhea.     Mouth/Throat:     Mouth: Mucous membranes are moist.     Pharynx: No oropharyngeal exudate.  Eyes:     General: No scleral icterus.       Right eye: No discharge.        Left eye: No discharge.     Conjunctiva/sclera: Conjunctivae normal.     Pupils: Pupils are equal, round, and reactive to light.   Neck:     Thyroid: No thyromegaly.     Vascular: No JVD.     Trachea: No tracheal deviation.  Cardiovascular:     Rate and Rhythm: Normal rate and regular rhythm.     Heart sounds: Normal heart sounds. No murmur heard.   No friction rub. No gallop.  Pulmonary:     Effort: No respiratory distress.     Breath sounds: Normal breath sounds. No wheezing or rales.  Abdominal:     General: Bowel sounds are normal.     Palpations: Abdomen is soft. There is no mass.     Tenderness:  There is no abdominal tenderness. There is no guarding or rebound.  Musculoskeletal:        General: No tenderness. Normal range of motion.     Cervical back: Normal range of motion and neck supple.  Lymphadenopathy:     Cervical: No cervical adenopathy.  Skin:    General: Skin is warm.     Findings: No rash.  Neurological:     Mental Status: He is alert and oriented to person, place, and time.     Cranial Nerves: No cranial nerve deficit.     Deep Tendon Reflexes: Reflexes are normal and symmetric.    Wt Readings from Last 3 Encounters:  02/26/21 192 lb (87.1 kg)  09/27/20 186 lb (84.4 kg)  09/14/20 188 lb 0.8 oz (85.3 kg)    BP 138/80   Pulse 72   Ht 5\' 11"  (1.803 m)   Wt 192 lb (87.1 kg)   BMI 26.78 kg/m   Assessment and Plan:  1. Diarrhea of presumed infectious origin New onset.  Persistent.  Onset on Thursday with spouse having diagnostic confirmation of Salmonella.  We will do stool culture and C. difficile and patient has been given azithromycin to start after obtaining culture. - Cdiff NAA+O+P+Stool Culture  2. Salmonella gastroenteritis Patient wife recently diagnosed with Salmonella but would like to confirm diagnosis before initiating azithromycin. - azithromycin (ZITHROMAX) 250 MG tablet; Take 2 tablets on day 1, then 1 tablet daily on days 2 through 5  Dispense: 6 tablet; Refill: 0  3. Dehydration Patient has some nausea with vomiting but that is decreased relative to diarrhea  multiple times during the day which is watery in nature.  We will proceed with antibiotic of azithromycin but obtain stool cultures prior.  Fluids and electrolyte replacement therapy has been recommended.  With discussion patient would like to obtain a specimen per his choice and I have encouraged him to proceed and upon receiving a fresh specimen and bring it to [place] tomorrow morning he is to initiate his azithromycin.

## 2021-02-26 NOTE — Telephone Encounter (Signed)
Pt c/o watery diarrhea since Saturday. Pt's wife dx with salmonella. Pt having mild and constant abdominal ache and cramping prior to BM that eases off but does not go away. Pt is tolerating fluids and denies nausea.   Pt has lost 4# and overall fatigue.  Pt is requesting an abx to be prescribed. Care advice given and pt verbalized understanding. Pt has phone appt at 1140 with PCP.      Reason for Disposition  [1] SEVERE diarrhea (e.g., 7 or more times / day more than normal) AND [2] age > 60 years  Answer Assessment - Initial Assessment Questions 1. DIARRHEA SEVERITY: "How bad is the diarrhea?" "How many more stools have you had in the past 24 hours than normal?"    - NO DIARRHEA (SCALE 0)   - MILD (SCALE 1-3): Few loose or mushy BMs; increase of 1-3 stools over normal daily number of stools; mild increase in ostomy output.   -  MODERATE (SCALE 4-7): Increase of 4-6 stools daily over normal; moderate increase in ostomy output. * SEVERE (SCALE 8-10; OR 'WORST POSSIBLE'): Increase of 7 or more stools daily over normal; moderate increase in ostomy output; incontinence.     severe 2. ONSET: "When did the diarrhea begin?"      Saturday 3. BM CONSISTENCY: "How loose or watery is the diarrhea?"      watery 4. VOMITING: "Are you also vomiting?" If Yes, ask: "How many times in the past 24 hours?"      no 5. ABDOMINAL PAIN: "Are you having any abdominal pain?" If Yes, ask: "What does it feel like?" (e.g., crampy, dull, intermittent, constant)      Constant ache-  crampy  6. ABDOMINAL PAIN SEVERITY: If present, ask: "How bad is the pain?"  (e.g., Scale 1-10; mild, moderate, or severe)   - MILD (1-3): doesn't interfere with normal activities, abdomen soft and not tender to touch    - MODERATE (4-7): interferes with normal activities or awakens from sleep, abdomen tender to touch    - SEVERE (8-10): excruciating pain, doubled over, unable to do any normal activities       Moderate and constant 7.  ORAL INTAKE: If vomiting, "Have you been able to drink liquids?" "How much liquids have you had in the past 24 hours?"     Yes-several glasses water and 2 gatorades (glass) 8. HYDRATION: "Any signs of dehydration?" (e.g., dry mouth [not just dry lips], too weak to stand, dizziness, new weight loss) "When did you last urinate?"     Weight loss 4#, fatigue 9. EXPOSURE: "Have you traveled to a foreign country recently?" "Have you been exposed to anyone with diarrhea?" "Could you have eaten any food that was spoiled?"     No-wife-no 10. ANTIBIOTIC USE: "Are you taking antibiotics now or have you taken antibiotics in the past 2 months?"       no 11. OTHER SYMPTOMS: "Do you have any other symptoms?" (e.g., fever, blood in stool)       no 12. PREGNANCY: "Is there any chance you are pregnant?" "When was your last menstrual period?"       N/a  Protocols used: [facility]

## 2021-02-27 ENCOUNTER — Telehealth: Payer: Self-pay

## 2021-02-27 NOTE — Telephone Encounter (Signed)
Called and spoke with patient. He said he dropped off his stool kits this morning with [facility]. Said he seen slightly tinted blood in his stool. His anus is very painful from the diarrhea.   Told patient to start Zpack this morning, and once we get the stool kit back we will call him with the results.   Patient verbalized understanding and will go to ER with any severe abdominal pain, or large amounts of blood in stool.

## 2021-03-04 LAB — CDIFF NAA+O+P+STOOL CULTURE
E coli, Shiga toxin Assay: NEGATIVE
Toxigenic C. Difficile by PCR: POSITIVE — AB

## 2021-03-28 ENCOUNTER — Encounter: Payer: BLUE CROSS/BLUE SHIELD | Admitting: Dermatology

## 2021-04-10 LAB — STATE LABORATORY REPORT

## 2021-04-18 ENCOUNTER — Other Ambulatory Visit: Payer: Self-pay

## 2021-04-18 ENCOUNTER — Ambulatory Visit (INDEPENDENT_AMBULATORY_CARE_PROVIDER_SITE_OTHER): Payer: Medicare Other | Admitting: Family Medicine

## 2021-04-18 ENCOUNTER — Encounter: Payer: Self-pay | Admitting: Family Medicine

## 2021-04-18 VITALS — BP 118/72 | HR 65 | Ht 71.0 in | Wt 196.0 lb

## 2021-04-18 DIAGNOSIS — H209 Unspecified iridocyclitis: Secondary | ICD-10-CM

## 2021-04-18 MED ORDER — PREDNISOLONE ACETATE 1 % OP SUSP: 2.0000 [drp] | Freq: Four times a day (QID) | OPHTHALMIC | 0 refills | Status: DC

## 2021-04-18 NOTE — Patient Instructions (Signed)
Uveitis Uveitis is one cause of inflammation of the eye. It often affects the middle part of the eye (uvea). This area contains many of the blood vessels that supply the rest of the eye. The uvea is made up of three structures: The middle layer of the eyeball (choroid). The colored part of the front of the eye (iris). The connection between the iris and the choroid (ciliary body). Uveitis can affect any part of the uvea, as well as other important structures in the eye. There are many types of uveitis: Iritis, or anterior uveitis, affects the iris. This is the most common type. It can start suddenly and can last for many weeks. Intermediate uveitis affects the structures in the middle of the eye. This includes the fluid that fills the eye (vitreous). This type can last for years and can come and go. Posterior uveitis affects the structures in the back of the eye. This includes the light-sensitive layer of cells that is needed for vision (retina). This is the least common type. Panuveitis affects all layers of the eye. Uveitis can affect one eye or both eyes. It can cause short-term or long-term symptoms. Symptoms may go away and come back. Over time, the condition can damage or destroy eye structures and may lead to temporary or permanent vision loss. What are the causes? This condition may be caused by: Autoimmune diseases. These are diseases in which the body's defense system (immune system) mistakenly attacks the body's own tissues. Infections that start in the eye or spread to the eye. Inflammatory diseases that can affect the eye. Eye injuries. Eye surgery. You may have medical tests, including blood tests, to help determine the cause of your uveitis. In some cases, the cause may not be known. What increases the risk? The following factors may make you more likely to develop this condition: Being 107-45 years old. Using tobacco products, such as cigarettes. Taking certain  medicines. Having certain medical conditions, including autoimmune conditions, sarcoidosis, tuberculosis, and syphilis. Having certain genes passed to you from a parent (inherited). What are the signs or symptoms? Symptoms of this condition depend on the type of uveitis. Common symptoms include: Eye redness. Eye pain. Blurred vision. Decreased vision. Having a blind spot in your vision. Floating dark spots in your vision (floaters). Seeing flashing lights (photopsia). Sensitivity to light (photophobia). A white section on the lower part of the iris (hypopyon). How is this diagnosed? This condition may be diagnosed based on: A vision test using eye charts. A complete eye exam. This type of eye exam usually involves eye drops to dilate your pupils to get a view into the back of the eye. If the back of the eye cannot be seen, an ultrasound exam may be done. A test to measure eye pressure. How is this treated? Treatment for this condition will depend on the type of uveitis that you have. It should be started right away to help prevent vision loss. Treatment may include: Medicines to treat inflammation (steroids). You may get steroids as eye drops, injections into the eye, or by mouth (orally). Medicines to treat infection. These may be given as eye drops, injections into the eye, or orally. Eye drops to dilate the pupil and reduce pressure inside the eye. In some cases, medicines may be given through an IV or through a device that is implanted inside the eye. Other medicines may be needed depending on the cause of your uveitis. In rare cases, surgery may be needed (vitrectomy). Follow these  instructions at home:  Take over-the-counter and prescription medicines only as told by your health care provider. Follow instructions for safely putting eye drops in your eyes. Drink enough fluid to keep your urine pale yellow. Follow instructions from your health care provider about any restrictions  on your activities. Ask what activities are safe for you. Do not use any products that contain nicotine or tobacco. These products include cigarettes, chewing tobacco, and vaping devices, such as e-cigarettes. If you need help quitting, ask your health care provider. Keep all follow-up visits. This is important. Contact a health care provider if: Your symptoms do not improve as expected. Get help right away if: You have vision loss in either eye. You have more redness in one or both eyes. Your eyes become more sensitive to light. You have more pain or aching in either eye. Summary Uveitis is inflammation of the eye. It often affects the middle part of the eye (uvea). This area contains many of the blood vessels that supply the rest of the eye. Uveitis can affect one eye or both eyes. It can cause short-term or long-term symptoms. Symptoms may go away and come back. Treatment for this condition will depend on the type of uveitis that you have. Treatment should be started right away to help prevent vision loss. Over time, the condition can damage or destroy eye structures and may lead to temporary or permanent vision loss. This information is not intended to replace advice given to you by your health care provider. Make sure you discuss any questions you have with your health care provider. Document Revised: 07/18/2020 Document Reviewed: 07/18/2020 Elsevier Patient Education  [place].

## 2021-04-18 NOTE — Progress Notes (Signed)
Date:  04/18/2021   Name:  Darren Page   DOB:  1955/12/20   MRN:  [id number]   Chief Complaint: No chief complaint on file.  Eye Pain  The left eye is affected. This is a new problem. The current episode started in the past 7 days. The problem occurs constantly. The pain is moderate. There is No known exposure to pink eye. He Does not wear contacts. Associated symptoms include blurred vision, eye redness, a foreign body sensation and photophobia. Pertinent negatives include no eye discharge, double vision, fever or itching.   Lab Results  Component Value Date   CREATININE 1.32 (H) 09/05/2020   BUN 29 (H) 09/05/2020   NA 137 09/05/2020   K 4.5 09/05/2020   CL 104 09/05/2020   CO2 26 09/05/2020   No results found for: CHOL, HDL, LDLCALC, LDLDIRECT, TRIG, CHOLHDL No results found for: TSH No results found for: HGBA1C Lab Results  Component Value Date   WBC 9.1 09/05/2020   HGB 17.9 (H) 09/05/2020   HCT 52.8 (H) 09/05/2020   MCV 94.8 09/05/2020   PLT 239 09/05/2020   Lab Results  Component Value Date   ALT 23 09/05/2020   AST 29 09/05/2020   ALKPHOS 68 09/05/2020   BILITOT 0.5 09/05/2020   No components found for: VITD  Review of Systems  Constitutional:  Negative for fever.  Eyes:  Positive for blurred vision, photophobia, pain, redness and visual disturbance. Negative for double vision, discharge and itching.  Respiratory:  Negative for cough and shortness of breath.    Patient Active Problem List   Diagnosis Date Noted   Positive fecal immunochemical test    Polyp of transverse colon    Coronary artery disease 11/22/2012   Hypercholesterolemia 11/22/2012   Tobacco use disorder 11/22/2012    Allergies  Allergen Reactions   Ezetimibe     Other reaction(s): Muscle Pain & joint pain   Atorvastatin Rash    myalgias    Past Surgical History:  Procedure Laterality Date   COLONOSCOPY WITH PROPOFOL N/A 08/23/2019   Procedure: COLONOSCOPY WITH  PROPOFOL;  Surgeon: Lucilla Lame, MD;  Location: [facility] ENDOSCOPY;  Service: Endoscopy;  Laterality: N/A;  priority 3   CYSTOSCOPY/URETEROSCOPY/HOLMIUM LASER/STENT PLACEMENT Right 09/14/2020   Procedure: CYSTOSCOPY/URETEROSCOPY/HOLMIUM LASER/STENT PLACEMENT;  Surgeon: [name], MD;  Location: [facility] ORS;  Service: Urology;  Laterality: Right;   SKIN CANCER EXCISION     mohs nose    Social History   Tobacco Use   Smoking status: Former    Packs/day: 1.00    Years: 50.00    Pack years: 50.00    Types: Cigarettes    Quit date: 02/03/2018    Years since quitting: 3.2   Smokeless tobacco: Never   Tobacco comments:    start back on patches  Vaping Use   Vaping Use: Never used  Substance Use Topics   Alcohol use: Yes    Alcohol/week: 1.0 standard drink    Types: 1 Cans of beer per week    Comment: occasionally    Drug use: No     Medication list has been reviewed and updated.  No outpatient medications have been marked as taking for the 04/18/21 encounter (Appointment) with Juline Patch, MD.    [facility] 2/9 Scores 02/26/2021 07/12/2019 01/15/2017  PHQ - 2 Score 0 0 0  PHQ- 9 Score 0 1 0    GAD 7 : Generalized Anxiety Score 02/26/2021 07/12/2019  Nervous, Anxious,  on Edge 0 0  Control/stop worrying 0 0  Worry too much - different things 0 0  Trouble relaxing 0 0  Restless 0 0  Easily annoyed or irritable 0 1  Afraid - awful might happen 0 1  Total GAD 7 Score 0 2  Anxiety Difficulty - Not difficult at all    BP Readings from Last 3 Encounters:  02/26/21 138/80  09/27/20 118/76  09/14/20 114/74    Physical Exam Vitals and nursing note reviewed.  HENT:     Head: Normocephalic.     Right Ear: Tympanic membrane and external ear normal.     Left Ear: Tympanic membrane and external ear normal.     Nose: Nose normal.  Eyes:     General: Lids are normal. Lids are everted, no foreign bodies appreciated. Vision grossly intact. Gaze aligned appropriately. No scleral icterus.        Right eye: No foreign body or discharge.        Left eye: No foreign body or discharge.     Extraocular Movements:     Right eye: Normal extraocular motion.     Left eye: Normal extraocular motion.     Conjunctiva/sclera:     Right eye: Right conjunctiva is injected.     Left eye: Left conjunctiva is injected.     Pupils: Pupils are equal, round, and reactive to light.  Neck:     Thyroid: No thyromegaly.     Vascular: No JVD.     Trachea: No tracheal deviation.  Cardiovascular:     Rate and Rhythm: Normal rate and regular rhythm.     Heart sounds: Normal heart sounds. No murmur heard.   No friction rub. No gallop.  Pulmonary:     Breath sounds: Normal breath sounds. No rales.  Abdominal:     General: Bowel sounds are normal.     Palpations: Abdomen is soft. There is no mass.     Tenderness: There is no abdominal tenderness. There is no guarding or rebound.  Musculoskeletal:        General: No tenderness. Normal range of motion.     Cervical back: Normal range of motion and neck supple.  Lymphadenopathy:     Cervical: No cervical adenopathy.  Skin:    General: Skin is warm.     Findings: No rash.  Neurological:     Mental Status: He is alert and oriented to person, place, and time.     Cranial Nerves: No cranial nerve deficit.     Deep Tendon Reflexes: Reflexes are normal and symmetric.    Wt Readings from Last 3 Encounters:  02/26/21 192 lb (87.1 kg)  09/27/20 186 lb (84.4 kg)  09/14/20 188 lb 0.8 oz (85.3 kg)    There were no vitals taken for this visit.  Assessment and Plan:  1. Iritis Acute.  Recurrent.  Relatively stable with photosensitivity.  Patient developed symptoms in the past 2448 hrs. and has in the past used prednisolone forte 1% of which he had drops that had expired but is not working.  There has been no injury or no work around any power tools.  It is uncertain the etiology and has seen eye doctors in the past and we will need to be rechecked in  the next 24 to 48 hours says that he had seen Dr. Marvel Plan in the past and will call to see if if he is available for recheck.  Patient was explained that this is  something that may be of an autoimmune possible infectious and will need pressures taken of the eye to make sure it is no other concern.  There was no signs of any foreign body and conjunctiva was mildly erythematous.- prednisoLONE acetate (PRED FORTE) 1 % ophthalmic suspension; Place 2 drops into the left eye 4 (four) times daily.  Dispense: 5 mL; Refill: 0

## 2021-06-05 ENCOUNTER — Other Ambulatory Visit: Payer: Self-pay

## 2021-06-05 ENCOUNTER — Ambulatory Visit (INDEPENDENT_AMBULATORY_CARE_PROVIDER_SITE_OTHER): Payer: Medicare Other | Admitting: Dermatology

## 2021-06-05 ENCOUNTER — Encounter: Payer: Self-pay | Admitting: Dermatology

## 2021-06-05 DIAGNOSIS — D1800 Hemangioma unspecified site: Secondary | ICD-10-CM

## 2021-06-05 DIAGNOSIS — Z1283 Encounter for screening for malignant neoplasm of skin: Secondary | ICD-10-CM | POA: Diagnosis not present

## 2021-06-05 DIAGNOSIS — Z85828 Personal history of other malignant neoplasm of skin: Secondary | ICD-10-CM | POA: Diagnosis not present

## 2021-06-05 DIAGNOSIS — L578 Other skin changes due to chronic exposure to nonionizing radiation: Secondary | ICD-10-CM | POA: Diagnosis not present

## 2021-06-05 DIAGNOSIS — L853 Xerosis cutis: Secondary | ICD-10-CM

## 2021-06-05 DIAGNOSIS — L821 Other seborrheic keratosis: Secondary | ICD-10-CM

## 2021-06-05 DIAGNOSIS — D229 Melanocytic nevi, unspecified: Secondary | ICD-10-CM

## 2021-06-05 DIAGNOSIS — L814 Other melanin hyperpigmentation: Secondary | ICD-10-CM

## 2021-06-05 NOTE — Progress Notes (Signed)
° °  Follow-Up Visit   Subjective  Darren Page is a 66 y.o. male who presents for the following: Annual Exam (History of BCC - TBSE today). The patient presents for Total-Body Skin Exam (TBSE) for skin cancer screening and mole check.  The patient has spots, moles and lesions to be evaluated, some may be new or changing and the patient has concerns that these could be cancer.  The following portions of the chart were reviewed this encounter and updated as appropriate:   Tobacco   Allergies   Meds   Problems   Med Hx   Surg Hx   Fam Hx      Review of Systems:  No other skin or systemic complaints except as noted in HPI or Assessment and Plan.  Objective  Well appearing patient in no apparent distress; mood and affect are within normal limits.  A full examination was performed including scalp, head, eyes, ears, nose, lips, neck, chest, axillae, abdomen, back, buttocks, bilateral upper extremities, bilateral lower extremities, hands, feet, fingers, toes, fingernails, and toenails. All findings within normal limits unless otherwise noted below.  Legs Xerosis   Assessment & Plan   History of Basal Cell Carcinoma of the Skin - dorsum nose - No evidence of recurrence today - Recommend regular full body skin exams - Recommend daily broad spectrum sunscreen SPF 30+ to sun-exposed areas, reapply every 2 hours as needed.  - Call if any new or changing lesions are noted between office visits  Lentigines - Scattered tan macules - Due to sun exposure - Benign-appearing, observe - Recommend daily broad spectrum sunscreen SPF 30+ to sun-exposed areas, reapply every 2 hours as needed. - Call for any changes  Seborrheic Keratoses - Stuck-on, waxy, tan-brown papules and/or plaques  - Benign-appearing - Discussed benign etiology and prognosis. - Observe - Call for any changes  Melanocytic Nevi - Tan-brown and/or pink-flesh-colored symmetric macules and papules - Benign appearing on  exam today - Observation - Call clinic for new or changing moles - Recommend daily use of broad spectrum spf 30+ sunscreen to sun-exposed areas.   Hemangiomas - Red papules - Discussed benign nature - Observe - Call for any changes  Actinic Damage - Chronic condition, secondary to cumulative UV/sun exposure - diffuse scaly erythematous macules with underlying dyspigmentation - Recommend daily broad spectrum sunscreen SPF 30+ to sun-exposed areas, reapply every 2 hours as needed.  - Staying in the shade or wearing long sleeves, sun glasses (UVA+UVB protection) and wide brim hats (4-inch brim around the entire circumference of the hat) are also recommended for sun protection.  - Call for new or changing lesions.  Skin cancer screening performed today.  Xerosis cutis Legs  Recommend moisturizer daily  Skin cancer screening   Return in about 1 year (around 06/05/2022) for TBSE.  I, Ashok Cordia, CMA, am acting as scribe for Sarina Ser, MD . Documentation: I have reviewed the above documentation for accuracy and completeness, and I agree with the above.  Sarina Ser, MD

## 2021-06-05 NOTE — Patient Instructions (Signed)

## 2021-06-06 ENCOUNTER — Encounter: Payer: Self-pay | Admitting: Dermatology

## 2021-08-28 ENCOUNTER — Ambulatory Visit (INDEPENDENT_AMBULATORY_CARE_PROVIDER_SITE_OTHER): Payer: Medicare Other | Admitting: Family Medicine

## 2021-08-28 ENCOUNTER — Other Ambulatory Visit: Payer: Self-pay

## 2021-08-28 ENCOUNTER — Encounter: Payer: Self-pay | Admitting: Family Medicine

## 2021-08-28 VITALS — BP 110/80 | HR 68 | Ht 71.0 in | Wt 197.0 lb

## 2021-08-28 DIAGNOSIS — J0100 Acute maxillary sinusitis, unspecified: Secondary | ICD-10-CM

## 2021-08-28 DIAGNOSIS — R051 Acute cough: Secondary | ICD-10-CM | POA: Diagnosis not present

## 2021-08-28 MED ORDER — AMOXICILLIN 500 MG PO CAPS: 500.0000 mg | ORAL_CAPSULE | Freq: Three times a day (TID) | ORAL | 0 refills | Status: AC

## 2021-08-28 MED ORDER — BENZONATATE 100 MG PO CAPS: 100.0000 mg | ORAL_CAPSULE | Freq: Three times a day (TID) | ORAL | 0 refills | Status: DC | PRN

## 2021-08-28 NOTE — Progress Notes (Signed)
? ? ?Date:  08/28/2021  ? ?Name:  Darren Page   DOB:  March 09, 1956   MRN:  [id number] ? ? ?Chief Complaint: Sinusitis (Congestion, facial pressure x 5 days) ? ?Sinusitis ?This is a new problem. The current episode started in the past 7 days. The problem has been gradually worsening since onset. There has been no fever. The pain is moderate. Associated symptoms include congestion. Pertinent negatives include no chills, coughing, diaphoresis, ear pain, headaches, hoarse voice, neck pain, shortness of breath, sinus pressure, sneezing, sore throat or swollen glands. (rhinorrhea) The treatment provided mild relief.  ? ?Lab Results  ?Component Value Date  ? NA 137 09/05/2020  ? K 4.5 09/05/2020  ? CO2 26 09/05/2020  ? GLUCOSE 125 (H) 09/05/2020  ? BUN 29 (H) 09/05/2020  ? CREATININE 1.32 (H) 09/05/2020  ? CALCIUM 9.2 09/05/2020  ? GFRNONAA 59 (L) 09/05/2020  ? ?No results found for: CHOL, HDL, LDLCALC, LDLDIRECT, TRIG, CHOLHDL ?No results found for: TSH ?No results found for: HGBA1C ?Lab Results  ?Component Value Date  ? WBC 9.1 09/05/2020  ? HGB 17.9 (H) 09/05/2020  ? HCT 52.8 (H) 09/05/2020  ? MCV 94.8 09/05/2020  ? PLT 239 09/05/2020  ? ?Lab Results  ?Component Value Date  ? ALT 23 09/05/2020  ? AST 29 09/05/2020  ? ALKPHOS 68 09/05/2020  ? BILITOT 0.5 09/05/2020  ? ?No results found for: 25OHVITD2, [place], VD25OH  ? ?Review of Systems  ?Constitutional:  Negative for chills, diaphoresis and fever.  ?HENT:  Positive for congestion. Negative for drooling, ear discharge, ear pain, hoarse voice, sinus pressure, sneezing and sore throat.   ?Respiratory:  Negative for cough, shortness of breath and wheezing.   ?Cardiovascular:  Negative for chest pain, palpitations and leg swelling.  ?Gastrointestinal:  Negative for abdominal pain, blood in stool, constipation, diarrhea and nausea.  ?Endocrine: Negative for polydipsia.  ?Genitourinary:  Negative for dysuria, frequency, hematuria and urgency.  ?Musculoskeletal:   Negative for back pain, myalgias and neck pain.  ?Skin:  Negative for rash.  ?Allergic/Immunologic: Negative for environmental allergies.  ?Neurological:  Negative for dizziness and headaches.  ?Hematological:  Does not bruise/bleed easily.  ?Psychiatric/Behavioral:  Negative for suicidal ideas. The patient is not nervous/anxious.   ? ?Patient Active Problem List  ? Diagnosis Date Noted  ? Positive fecal immunochemical test   ? Polyp of transverse colon   ? Abnormal stress test 02/03/2018  ? Pain in both upper extremities 02/03/2018  ? Coronary artery disease 11/22/2012  ? Hypercholesterolemia 11/22/2012  ? Tobacco use disorder 11/22/2012  ? ? ?Allergies  ?Allergen Reactions  ? Ezetimibe   ?  Other reaction(s): Muscle Pain ?& joint pain  ? Atorvastatin Rash  ?  myalgias  ? ? ?Past Surgical History:  ?Procedure Laterality Date  ? COLONOSCOPY WITH PROPOFOL N/A 08/23/2019  ? Procedure: COLONOSCOPY WITH PROPOFOL;  Surgeon: Lucilla Lame, MD;  Location: [facility] ENDOSCOPY;  Service: Endoscopy;  Laterality: N/A;  priority 3  ? CYSTOSCOPY/URETEROSCOPY/HOLMIUM LASER/STENT PLACEMENT Right 09/14/2020  ? Procedure: CYSTOSCOPY/URETEROSCOPY/HOLMIUM LASER/STENT PLACEMENT;  Surgeon: [name], MD;  Location: [facility] ORS;  Service: Urology;  Laterality: Right;  ? SKIN CANCER EXCISION    ? mohs nose  ? ? ?Social History  ? ?Tobacco Use  ? Smoking status: Former  ?  Packs/day: 1.00  ?  Years: 50.00  ?  Pack years: 50.00  ?  Types: Cigarettes  ?  Quit date: 02/03/2018  ?  Years since quitting: 3.5  ?  Smokeless tobacco: Never  ? Tobacco comments:  ?  start back on patches  ?Vaping Use  ? Vaping Use: Never used  ?Substance Use Topics  ? Alcohol use: Yes  ?  Alcohol/week: 1.0 standard drink  ?  Types: 1 Cans of beer per week  ?  Comment: occasionally   ? Drug use: No  ? ? ? ?Medication list has been reviewed and updated. ? ?Current Meds  ?Medication Sig  ? acetaminophen (TYLENOL) 500 MG tablet Take 1,000 mg by mouth every 8 (eight) hours as  needed for moderate pain.  ? aspirin 81 MG chewable tablet Chew 81 mg by mouth daily.  ? cholecalciferol (VITAMIN D3) 25 MCG (1000 UNIT) tablet Take 1,000 Units by mouth daily.  ? metoprolol succinate (TOPROL-XL) 25 MG 24 hr tablet Take 25 mg by mouth daily.  ? nitroGLYCERIN (NITROSTAT) 0.4 MG SL tablet Place 0.4 mg under the tongue every 5 (five) minutes as needed for chest pain.  ? Omega-3 1000 MG CAPS Take 1,000 mg by mouth daily.  ? rosuvastatin (CRESTOR) 5 MG tablet Take 5 mg by mouth every other day.  ? ? ? ?  08/28/2021  ?  9:50 AM 04/18/2021  ?  9:32 AM 02/26/2021  ?  4:08 PM 07/12/2019  ?  3:07 PM  ?GAD 7 : Generalized Anxiety Score  ?Nervous, Anxious, on Edge 0 0 0 0  ?Control/stop worrying 0 0 0 0  ?Worry too much - different things 0 0 0 0  ?Trouble relaxing 0 0 0 0  ?Restless 0 0 0 0  ?Easily annoyed or irritable 0 0 0 1  ?Afraid - awful might happen 0 0 0 1  ?Total GAD 7 Score 0 0 0 2  ?Anxiety Difficulty Not difficult at all Not difficult at all  Not difficult at all  ? ? ? ?  08/28/2021  ?  9:50 AM  ?Depression screen PHQ 2/9  ?Decreased Interest 0  ?Down, Depressed, Hopeless 0  ?PHQ - 2 Score 0  ?Altered sleeping 0  ?Tired, decreased energy 0  ?Change in appetite 0  ?Feeling bad or failure about yourself  0  ?Trouble concentrating 0  ?Moving slowly or fidgety/restless 0  ?Suicidal thoughts 0  ?PHQ-9 Score 0  ?Difficult doing work/chores Not difficult at all  ? ? ?BP Readings from Last 3 Encounters:  ?08/28/21 110/80  ?04/18/21 118/72  ?02/26/21 138/80  ? ? ?Physical Exam ?Vitals and nursing note reviewed.  ?HENT:  ?   Head: Normocephalic.  ?   Right Ear: Tympanic membrane, ear canal and external ear normal. There is no impacted cerumen.  ?   Left Ear: Tympanic membrane, ear canal and external ear normal. There is no impacted cerumen.  ?   Nose: Nose normal. No congestion or rhinorrhea.  ?   Right Sinus: No maxillary sinus tenderness or frontal sinus tenderness.  ?   Left Sinus: No maxillary sinus  tenderness or frontal sinus tenderness.  ?   Mouth/Throat:  ?   Mouth: Mucous membranes are moist. No oral lesions.  ?   Dentition: Normal dentition.  ?   Tongue: No lesions.  ?   Palate: No mass and lesions.  ?   Pharynx: Oropharynx is clear.  ?Eyes:  ?   General: No scleral icterus.    ?   Right eye: No discharge.     ?   Left eye: No discharge.  ?   Conjunctiva/sclera: Conjunctivae normal.  ?  Pupils: Pupils are equal, round, and reactive to light.  ?Neck:  ?   Thyroid: No thyromegaly.  ?   Vascular: No JVD.  ?   Trachea: No tracheal deviation.  ?Cardiovascular:  ?   Rate and Rhythm: Normal rate and regular rhythm.  ?   Heart sounds: Normal heart sounds. No murmur heard. ?  No friction rub. No gallop.  ?Pulmonary:  ?   Effort: No respiratory distress.  ?   Breath sounds: Normal breath sounds. No wheezing, rhonchi or rales.  ?Abdominal:  ?   General: Bowel sounds are normal.  ?   Palpations: Abdomen is soft. There is no mass.  ?   Tenderness: There is no abdominal tenderness. There is no guarding or rebound.  ?Musculoskeletal:     ?   General: No tenderness. Normal range of motion.  ?   Cervical back: Normal range of motion and neck supple.  ?Lymphadenopathy:  ?   Cervical: No cervical adenopathy.  ?Skin: ?   General: Skin is warm.  ?   Findings: No rash.  ?Neurological:  ?   Mental Status: He is alert and oriented to person, place, and time.  ?   Cranial Nerves: No cranial nerve deficit.  ?   Deep Tendon Reflexes: Reflexes are normal and symmetric.  ? ? ?Wt Readings from Last 3 Encounters:  ?08/28/21 197 lb (89.4 kg)  ?04/18/21 196 lb (88.9 kg)  ?02/26/21 192 lb (87.1 kg)  ? ? ?BP 110/80   Pulse 68   Ht '5\' 11"'$  (1.803 m)   Wt 197 lb (89.4 kg)   BMI 27.48 kg/m?  ? ?Assessment and Plan: ? ?1. Acute maxillary sinusitis, recurrence not specified ?New onset.  Persistent.  Relatively stable but given past history and current symptomatology we will treat as acute sinusitis with amoxicillin 500 mg 3 times a day. ?-  amoxicillin (AMOXIL) 500 MG capsule; Take 1 capsule (500 mg total) by mouth 3 (three) times daily for 10 days.  Dispense: 30 capsule; Refill: 0 ? ?2. Acute cough ?Acute.  Episodic.  Stable.  As needed basis for coug

## 2021-10-21 ENCOUNTER — Ambulatory Visit (INDEPENDENT_AMBULATORY_CARE_PROVIDER_SITE_OTHER): Payer: Medicare Other

## 2021-10-21 DIAGNOSIS — Z0000 Encounter for general adult medical examination without abnormal findings: Secondary | ICD-10-CM

## 2021-10-21 DIAGNOSIS — Z1211 Encounter for screening for malignant neoplasm of colon: Secondary | ICD-10-CM | POA: Diagnosis not present

## 2021-10-21 NOTE — Progress Notes (Signed)
Subjective:   Avedis Bevis is a 66 y.o. male who presents for an Initial Medicare Annual Wellness Visit.  Virtual Visit via Telephone Note  I connected with  Dorna Bloom Vogan on 10/21/21 at  1:00 PM EDT by telephone and verified that I am speaking with the correct person using two identifiers.  Location: Patient: home Provider: [facility] Persons participating in the virtual visit: Sylva   I discussed the limitations, risks, security and privacy concerns of performing an evaluation and management service by telephone and the availability of in person appointments. The patient expressed understanding and agreed to proceed.  Interactive audio and video telecommunications were attempted between this nurse and patient, however failed, due to patient having technical difficulties OR patient did not have access to video capability.  We continued and completed visit with audio only.  Some vital signs may be absent or patient reported.   Clemetine Marker, LPN   Review of Systems     Cardiac Risk Factors include: advanced age (>74mn, >>42women);male gender;dyslipidemia     Objective:    There were no vitals filed for this visit. There is no height or weight on file to calculate BMI.     10/21/2021    1:08 PM 09/13/2020   11:28 AM 09/05/2020    2:38 PM 07/31/2018    9:25 AM  Advanced Directives  Does Patient Have a Medical Advance Directive? No No No No  Would patient like information on creating a medical advance directive? Yes (MAU/Ambulatory/Procedural Areas - Information given)       Current Medications (verified) Outpatient Encounter Medications as of 10/21/2021  Medication Sig   acetaminophen (TYLENOL) 500 MG tablet Take 1,000 mg by mouth every 8 (eight) hours as needed for moderate pain.   aspirin 81 MG chewable tablet Chew 81 mg by mouth daily.   cholecalciferol (VITAMIN D3) 25 MCG (1000 UNIT) tablet Take 1,000 Units by mouth daily.   metoprolol  succinate (TOPROL-XL) 25 MG 24 hr tablet Take 25 mg by mouth daily.   nitroGLYCERIN (NITROSTAT) 0.4 MG SL tablet Place 0.4 mg under the tongue every 5 (five) minutes as needed for chest pain.   Omega-3 1000 MG CAPS Take 1,000 mg by mouth daily.   rosuvastatin (CRESTOR) 5 MG tablet Take 5 mg by mouth every other day.   [DISCONTINUED] benzonatate (TESSALON PERLES) 100 MG capsule Take 1 capsule (100 mg total) by mouth 3 (three) times daily as needed for cough.   No facility-administered encounter medications on file as of 10/21/2021.    Allergies (verified) Ezetimibe and Atorvastatin   History: Past Medical History:  Diagnosis Date   Aneurysm of aorta (HCC)    Basal cell carcinoma    dorsum nose   COPD (chronic obstructive pulmonary disease) (HCC)    Coronary artery disease    History of acute anterior wall MI 12/2003   History of kidney stones    History of sebaceous cyst 07/19/2017   Left shoulder   Hyperlipidemia    Hypertension    Past Surgical History:  Procedure Laterality Date   COLONOSCOPY WITH PROPOFOL N/A 08/23/2019   Procedure: COLONOSCOPY WITH PROPOFOL;  Surgeon: WLucilla Lame MD;  Location: [facility] ENDOSCOPY;  Service: Endoscopy;  Laterality: N/A;  priority 3   CYSTOSCOPY/URETEROSCOPY/HOLMIUM LASER/STENT PLACEMENT Right 09/14/2020   Procedure: CYSTOSCOPY/URETEROSCOPY/HOLMIUM LASER/STENT PLACEMENT;  Surgeon: SBilley Co MD;  Location: [facility] ORS;  Service: Urology;  Laterality: Right;   SKIN CANCER EXCISION     mohs  nose   Family History  Problem Relation Age of Onset   Cancer Mother    Healthy Father    Prostate cancer Father    Social History   Socioeconomic History   Marital status: Married    Spouse name: Not on file   Number of children: Not on file   Years of education: Not on file   Highest education level: Not on file  Occupational History   Not on file  Tobacco Use   Smoking status: Former    Packs/day: 1.00    Years: 50.00    Pack years: 50.00     Types: Cigarettes    Quit date: 02/03/2018    Years since quitting: 3.7   Smokeless tobacco: Never   Tobacco comments:    start back on patches  Vaping Use   Vaping Use: Never used  Substance and Sexual Activity   Alcohol use: Yes    Alcohol/week: 1.0 standard drink    Types: 1 Cans of beer per week    Comment: occasionally    Drug use: No   Sexual activity: Not Currently  Other Topics Concern   Not on file  Social History Narrative   Not on file   Social Determinants of Health   Financial Resource Strain: Low Risk    Difficulty of Paying Living Expenses: Not hard at all  Food Insecurity: No Food Insecurity   Worried About Charity fundraiser in the Last Year: Never true   [place] in the Last Year: Never true  Transportation Needs: No Transportation Needs   Lack of Transportation (Medical): No   Lack of Transportation (Non-Medical): No  Physical Activity: Sufficiently Active   Days of Exercise per Week: 5 days   Minutes of Exercise per Session: 40 min  Stress: No Stress Concern Present   Feeling of Stress : Not at all  Social Connections: Unknown   Frequency of Communication with Friends and Family: Not on file   Frequency of Social Gatherings with Friends and Family: Twice a week   Attends Religious Services: More than 4 times per year   Active Member of [employer] or Organizations: Yes   Attends Music therapist: More than 4 times per year   Marital Status: Married    Tobacco Counseling Counseling given: Not Answered Tobacco comments: start back on patches   Clinical Intake:  Pre-visit preparation completed: Yes  Pain : No/denies pain     Nutritional Risks: None Diabetes: No  How often do you need to have someone help you when you read instructions, pamphlets, or other written materials from your doctor or pharmacy?: 1 - Never   Interpreter Needed?: No  Information entered by :: Clemetine Marker LPN   Activities of Daily Living     10/21/2021    1:09 PM 10/21/2021   10:55 AM  In your present state of health, do you have any difficulty performing the following activities:  Hearing? 0 1  Vision? 0 0  Difficulty concentrating or making decisions? 0 0  Walking or climbing stairs? 0 0  Dressing or bathing? 0 0  Doing errands, shopping? 0 0  Preparing Food and eating ? N N  Using the Toilet? N N  In the past six months, have you accidently leaked urine? N N  Do you have problems with loss of bowel control? N N  Managing your Medications? N N  Managing your Finances? N N  Housekeeping or managing your Housekeeping?  N N    Patient Care Team: Juline Patch, MD as PCP - General (Family Medicine)  Indicate any recent Medical Services you may have received from other than [facility] providers in the past year (date may be approximate).     Assessment:   This is a routine wellness examination for Danh.  Hearing/Vision screen Hearing Screening - Comments:: Pt wears hearing aids  Vision Screening - Comments:: Annual vision screenings done by Dr. Marvel Plan   Dietary issues and exercise activities discussed: Current Exercise Habits: Home exercise routine, Type of exercise: walking, Time (Minutes): 40, Frequency (Times/Week): 5, Weekly Exercise (Minutes/Week): 200, Intensity: Moderate, Exercise limited by: None identified   Goals Addressed   None    Depression Screen    10/21/2021    1:07 PM 08/28/2021    9:50 AM 04/18/2021    9:31 AM 02/26/2021    4:07 PM 07/12/2019    3:06 PM 01/15/2017    3:47 PM  PHQ 2/9 Scores  PHQ - 2 Score 0 0 0 0 0 0  PHQ- 9 Score  0 0 0 1 0    Fall Risk    10/21/2021    1:09 PM 10/21/2021   10:55 AM 04/18/2021    9:32 AM 02/26/2021    4:07 PM 07/12/2019    3:06 PM  [place] in the past year? 0 0 0 0 0  Number falls in past yr: 0 0 0 0   Injury with Fall? 0 0 0 0   Risk for fall due to : No Fall Risks  No Fall Risks No Fall Risks   Follow up Falls prevention discussed  Falls  evaluation completed Falls evaluation completed Falls evaluation completed    [place]:  Any stairs in or around the home? Yes  If so, are there any without handrails? No  Home free of loose throw rugs in walkways, pet beds, electrical cords, etc? Yes  Adequate lighting in your home to reduce risk of falls? Yes   ASSISTIVE DEVICES UTILIZED TO PREVENT FALLS:  Life alert? No  Use of a cane, walker or w/c? No  Grab bars in the bathroom? No  Shower chair or bench in shower? No  Elevated toilet seat or a handicapped toilet? No   TIMED UP AND GO:  Was the test performed? No . Telephonic visit.   Cognitive Function: Normal cognitive status assessed by direct observation by this Nurse Health Advisor. No abnormalities found.          Immunizations  There is no immunization history on file for this patient.  TDAP status: Due, Education has been provided regarding the importance of this vaccine. Advised may receive this vaccine at local pharmacy or Health Dept. Aware to provide a copy of the vaccination record if obtained from local pharmacy or Health Dept. Verbalized acceptance and understanding.  Flu Vaccine status: Up to date  Pneumococcal vaccine status: Up to date  Covid-19 vaccine status: Completed vaccines  Qualifies for Shingles Vaccine? Yes   Zostavax completed No   Shingrix Completed?: No.    Education has been provided regarding the importance of this vaccine. Patient has been advised to call insurance company to determine out of pocket expense if they have not yet received this vaccine. Advised may also receive vaccine at local pharmacy or Health Dept. Verbalized acceptance and understanding.  Screening Tests Health Maintenance  Topic Date Due   COVID-19 Vaccine (1) Never done  Zoster Vaccines- Shingrix (1 of 2) 11/15/2021 (Originally 04/01/1975)   COLONOSCOPY (Pts 45-63yr Insurance coverage will need to be confirmed)  02/26/2022  (Originally 08/22/2020)   Hepatitis C Screening  02/26/2022 (Originally 04/01/1974)   Pneumonia Vaccine 68 Years old (1 - PCV) 08/16/2022 (Originally 04/01/1962)   INFLUENZA VACCINE  12/31/2021   HPV VACCINES  Aged Out   TETANUS/TDAP  Discontinued    Health Maintenance  Health Maintenance Due  Topic Date Due   COVID-19 Vaccine (1) Never done    Colorectal cancer screening: Type of screening: Colonoscopy. Completed 08/23/19. Repeat every 1 years. [facility]   Lung Cancer Screening: (Low Dose CT Chest recommended if Age 68-80years, 30 pack-year currently smoking OR have quit w/in 15years.) does not qualify.    Additional Screening:  Hepatitis C Screening: does qualify; due  Vision Screening: Recommended annual ophthalmology exams for early detection of glaucoma and other disorders of the eye. Is the patient up to date with their annual eye exam?  Yes  Who is the provider or what is the name of the office in which the patient attends annual eye exams? Dr. RMarvel Plan   Dental Screening: Recommended annual dental exams for proper oral hygiene  Community Resource Referral / Chronic Care Management: CRR required this visit?  No   CCM required this visit?  No      Plan:     I have personally reviewed and noted the following in the patient's chart:   Medical and social history Use of alcohol, tobacco or illicit drugs  Current medications and supplements including opioid prescriptions. Patient is not currently taking opioid prescriptions. Functional ability and status Nutritional status Physical activity Advanced directives List of other physicians Hospitalizations, surgeries, and ER visits in previous 12 months Vitals Screenings to include cognitive, depression, and falls Referrals and appointments  In addition, I have reviewed and discussed with patient certain preventive protocols, quality metrics, and best practice  recommendations. A written personalized care plan for preventive services as well as general preventive health recommendations were provided to patient.     KClemetine Marker LPN   [id number]  Nurse Notes: none

## 2021-10-21 NOTE — Patient Instructions (Signed)
[name] , Thank you for taking time to come for your Medicare Wellness Visit. I appreciate your ongoing commitment to your health goals. Please review the following plan we discussed and let me know if I can assist you in the future.   Screening recommendations/referrals: Colonoscopy: done 08/23/19. A referral has been sent to [facility] for repeat screening colonoscopy. They will contact you for an appointment.  Recommended yearly ophthalmology/optometry visit for glaucoma screening and checkup Recommended yearly dental visit for hygiene and checkup  Vaccinations: Influenza vaccine: declined Pneumococcal vaccine: declined Tdap vaccine: due Shingles vaccine: Shingrix discussed. Please contact your pharmacy for coverage information.  Covid-19: please bring a copy of your vaccine record to your next appt  Advanced directives: Advance directive discussed with you today. I have provided a copy for you to complete at home and have notarized. Once this is complete please bring a copy in to our office so we can scan it into your chart.   Conditions/risks identified: Keep up the great work!  Next appointment: Follow up in one year for your annual wellness visit.   Preventive Care 89 Years and Older, Male Preventive care refers to lifestyle choices and visits with your health care provider that can promote health and wellness. What does preventive care include? A yearly physical exam. This is also called an annual well check. Dental exams once or twice a year. Routine eye exams. Ask your health care provider how often you should have your eyes checked. Personal lifestyle choices, including: Daily care of your teeth and gums. Regular physical activity. Eating a healthy diet. Avoiding tobacco and drug use. Limiting alcohol use. Practicing safe sex. Taking low doses of aspirin every day. Taking vitamin and mineral supplements as recommended by your health care  provider. What happens during an annual well check? The services and screenings done by your health care provider during your annual well check will depend on your age, overall health, lifestyle risk factors, and family history of disease. Counseling  Your health care provider may ask you questions about your: Alcohol use. Tobacco use. Drug use. Emotional well-being. Home and relationship well-being. Sexual activity. Eating habits. History of falls. Memory and ability to understand (cognition). Work and work Statistician. Screening  You may have the following tests or measurements: Height, weight, and BMI. Blood pressure. Lipid and cholesterol levels. These may be checked every 5 years, or more frequently if you are over 79 years old. Skin check. Lung cancer screening. You may have this screening every year starting at age 7 if you have a 30-pack-year history of smoking and currently smoke or have quit within the past 15 years. Fecal occult blood test (FOBT) of the stool. You may have this test every year starting at age 31. Flexible sigmoidoscopy or colonoscopy. You may have a sigmoidoscopy every 5 years or a colonoscopy every 10 years starting at age 51. Prostate cancer screening. Recommendations will vary depending on your family history and other risks. Hepatitis C blood test. Hepatitis B blood test. Sexually transmitted disease (STD) testing. Diabetes screening. This is done by checking your blood sugar (glucose) after you have not eaten for a while (fasting). You may have this done every 1-3 years. Abdominal aortic aneurysm (AAA) screening. You may need this if you are a current or former smoker. Osteoporosis. You may be screened starting at age 31 if you are at high risk. Talk with your health care provider about your test results, treatment options, and if necessary, the need  for more tests. Vaccines  Your health care provider may recommend certain vaccines, such  as: Influenza vaccine. This is recommended every year. Tetanus, diphtheria, and acellular pertussis (Tdap, Td) vaccine. You may need a Td booster every 10 years. Zoster vaccine. You may need this after age 82. Pneumococcal 13-valent conjugate (PCV13) vaccine. One dose is recommended after age 43. Pneumococcal polysaccharide (PPSV23) vaccine. One dose is recommended after age 94. Talk to your health care provider about which screenings and vaccines you need and how often you need them. This information is not intended to replace advice given to you by your health care provider. Make sure you discuss any questions you have with your health care provider. Document Released: 06/15/2015 Document Revised: 02/06/2016 Document Reviewed: 03/20/2015 Elsevier Interactive Patient Education  2017 [place] Prevention in the Home Falls can cause injuries. They can happen to people of all ages. There are many things you can do to make your home safe and to help prevent falls. What can I do on the outside of my home? Regularly fix the edges of walkways and driveways and fix any cracks. Remove anything that might make you trip as you walk through a door, such as a raised step or threshold. Trim any bushes or trees on the path to your home. Use bright outdoor lighting. Clear any walking paths of anything that might make someone trip, such as rocks or tools. Regularly check to see if handrails are loose or broken. Make sure that both sides of any steps have handrails. Any raised decks and porches should have guardrails on the edges. Have any leaves, snow, or ice cleared regularly. Use sand or salt on walking paths during winter. Clean up any spills in your garage right away. This includes oil or grease spills. What can I do in the bathroom? Use night lights. Install grab bars by the toilet and in the tub and shower. Do not use towel bars as grab bars. Use non-skid mats or decals in the tub or  shower. If you need to sit down in the shower, use a plastic, non-slip stool. Keep the floor dry. Clean up any water that spills on the floor as soon as it happens. Remove soap buildup in the tub or shower regularly. Attach bath mats securely with double-sided non-slip rug tape. Do not have throw rugs and other things on the floor that can make you trip. What can I do in the bedroom? Use night lights. Make sure that you have a light by your bed that is easy to reach. Do not use any sheets or blankets that are too big for your bed. They should not hang down onto the floor. Have a firm chair that has side arms. You can use this for support while you get dressed. Do not have throw rugs and other things on the floor that can make you trip. What can I do in the kitchen? Clean up any spills right away. Avoid walking on wet floors. Keep items that you use a lot in easy-to-reach places. If you need to reach something above you, use a strong step stool that has a grab bar. Keep electrical cords out of the way. Do not use floor polish or wax that makes floors slippery. If you must use wax, use non-skid floor wax. Do not have throw rugs and other things on the floor that can make you trip. What can I do with my stairs? Do not leave any items on the stairs.  Make sure that there are handrails on both sides of the stairs and use them. Fix handrails that are broken or loose. Make sure that handrails are as long as the stairways. Check any carpeting to make sure that it is firmly attached to the stairs. Fix any carpet that is loose or worn. Avoid having throw rugs at the top or bottom of the stairs. If you do have throw rugs, attach them to the floor with carpet tape. Make sure that you have a light switch at the top of the stairs and the bottom of the stairs. If you do not have them, ask someone to add them for you. What else can I do to help prevent falls? Wear shoes that: Do not have high heels. Have  rubber bottoms. Are comfortable and fit you well. Are closed at the toe. Do not wear sandals. If you use a stepladder: Make sure that it is fully opened. Do not climb a closed stepladder. Make sure that both sides of the stepladder are locked into place. Ask someone to hold it for you, if possible. Clearly mark and make sure that you can see: Any grab bars or handrails. First and last steps. Where the edge of each step is. Use tools that help you move around (mobility aids) if they are needed. These include: Canes. Walkers. Scooters. Crutches. Turn on the lights when you go into a dark area. Replace any light bulbs as soon as they burn out. Set up your furniture so you have a clear path. Avoid moving your furniture around. If any of your floors are uneven, fix them. If there are any pets around you, be aware of where they are. Review your medicines with your doctor. Some medicines can make you feel dizzy. This can increase your chance of falling. Ask your doctor what other things that you can do to help prevent falls. This information is not intended to replace advice given to you by your health care provider. Make sure you discuss any questions you have with your health care provider. Document Released: 03/15/2009 Document Revised: 10/25/2015 Document Reviewed: 06/23/2014 Elsevier Interactive Patient Education  2017 [employer].

## 2021-10-22 ENCOUNTER — Telehealth: Payer: Self-pay

## 2021-10-22 NOTE — Telephone Encounter (Signed)
Sent out letter to contact patient

## 2022-06-11 ENCOUNTER — Encounter: Payer: Self-pay | Admitting: Dermatology

## 2022-06-11 ENCOUNTER — Ambulatory Visit (INDEPENDENT_AMBULATORY_CARE_PROVIDER_SITE_OTHER): Payer: Medicare Other | Admitting: Dermatology

## 2022-06-11 VITALS — BP 142/98 | HR 65

## 2022-06-11 DIAGNOSIS — L814 Other melanin hyperpigmentation: Secondary | ICD-10-CM | POA: Diagnosis not present

## 2022-06-11 DIAGNOSIS — D485 Neoplasm of uncertain behavior of skin: Secondary | ICD-10-CM | POA: Diagnosis not present

## 2022-06-11 DIAGNOSIS — L821 Other seborrheic keratosis: Secondary | ICD-10-CM

## 2022-06-11 DIAGNOSIS — Z1283 Encounter for screening for malignant neoplasm of skin: Secondary | ICD-10-CM | POA: Diagnosis not present

## 2022-06-11 DIAGNOSIS — D492 Neoplasm of unspecified behavior of bone, soft tissue, and skin: Secondary | ICD-10-CM

## 2022-06-11 DIAGNOSIS — Z85828 Personal history of other malignant neoplasm of skin: Secondary | ICD-10-CM

## 2022-06-11 DIAGNOSIS — L578 Other skin changes due to chronic exposure to nonionizing radiation: Secondary | ICD-10-CM | POA: Diagnosis not present

## 2022-06-11 DIAGNOSIS — D229 Melanocytic nevi, unspecified: Secondary | ICD-10-CM

## 2022-06-11 NOTE — Progress Notes (Unsigned)
Follow-Up Visit   Subjective  Darren Page is a 67 y.o. male who presents for the following: Total body skin exam (Hx of BCC dorsum nose). The patient presents for Total-Body Skin Exam (TBSE) for skin cancer screening and mole check.  The patient has spots, moles and lesions to be evaluated, some may be new or changing and the patient has concerns that these could be cancer.   The following portions of the chart were reviewed this encounter and updated as appropriate:   Tobacco  Allergies  Meds  Problems  Med Hx  Surg Hx  Fam Hx     Review of Systems:  No other skin or systemic complaints except as noted in HPI or Assessment and Plan.  Objective  Well appearing patient in no apparent distress; mood and affect are within normal limits.  A full examination was performed including scalp, head, eyes, ears, nose, lips, neck, chest, axillae, abdomen, back, buttocks, bilateral upper extremities, bilateral lower extremities, hands, feet, fingers, toes, fingernails, and toenails. All findings within normal limits unless otherwise noted below.  sternum Hypopigmented patch with central induration 3.0 x 1.7cm, indurated are is 1.0cm        Assessment & Plan   Lentigines - Scattered tan macules - Due to sun exposure - Benign-appearing, observe - Recommend daily broad spectrum sunscreen SPF 30+ to sun-exposed areas, reapply every 2 hours as needed. - Call for any changes - face, back  Seborrheic Keratoses - Stuck-on, waxy, tan-brown papules and/or plaques  - Benign-appearing - Discussed benign etiology and prognosis. - Observe - Call for any changes - face, back  Melanocytic Nevi - Tan-brown and/or pink-flesh-colored symmetric macules and papules - Benign appearing on exam today - Observation - Call clinic for new or changing moles - Recommend daily use of broad spectrum spf 30+ sunscreen to sun-exposed areas.   Hemangiomas - Red papules - Discussed benign  nature - Observe - Call for any changes - trunk  Actinic Damage - Chronic condition, secondary to cumulative UV/sun exposure - diffuse scaly erythematous macules with underlying dyspigmentation - Recommend daily broad spectrum sunscreen SPF 30+ to sun-exposed areas, reapply every 2 hours as needed.  - Staying in the shade or wearing long sleeves, sun glasses (UVA+UVB protection) and wide brim hats (4-inch brim around the entire circumference of the hat) are also recommended for sun protection.  - Call for new or changing lesions. - face  Skin cancer screening performed today.   History of Basal Cell Carcinoma of the Skin - No evidence of recurrence today - Recommend regular full body skin exams - Recommend daily broad spectrum sunscreen SPF 30+ to sun-exposed areas, reapply every 2 hours as needed.  - Call if any new or changing lesions are noted between office visits  - Dorsum nose  Neoplasm of skin sternum  Skin / nail biopsy Type of biopsy: tangential   Informed consent: discussed and consent obtained   Timeout: patient name, date of birth, surgical site, and procedure verified   Procedure prep:  Patient was prepped and draped in usual sterile fashion Prep type:  Isopropyl alcohol Anesthesia: the lesion was anesthetized in a standard fashion   Anesthetic:  1% lidocaine w/ epinephrine 1-100,000 buffered w/ 8.4% NaHCO3 Instrument used: flexible razor blade   Outcome: patient tolerated procedure well   Post-procedure details: sterile dressing applied and wound care instructions given   Dressing type: bandage and bacitracin    Specimen 1 - Surgical pathology Differential Diagnosis: D48.5  R/O BCC  Check Margins: No Hypopigmented patch with central induration   Return in about 1 year (around 06/12/2023) for TBSE, Hx of BCC.  I, Othelia Pulling, RMA, am acting as scribe for Sarina Ser, MD . Documentation: I have reviewed the above documentation for accuracy and  completeness, and I agree with the above.  Sarina Ser, MD

## 2022-06-11 NOTE — Patient Instructions (Addendum)
Wound Care Instructions  Cleanse wound gently with soap and water once a day then pat dry with clean gauze. Apply a thin coat of Petrolatum (petroleum jelly, "Vaseline") over the wound (unless you have an allergy to this). We recommend that you use a new, sterile tube of Vaseline. Do not pick or remove scabs. Do not remove the yellow or white "healing tissue" from the base of the wound.  Cover the wound with fresh, clean, nonstick gauze and secure with paper tape. You may use Band-Aids in place of gauze and tape if the wound is small enough, but would recommend trimming much of the tape off as there is often too much. Sometimes Band-Aids can irritate the skin.  You should call the office for your biopsy report after 1 week if you have not already been contacted.  If you experience any problems, such as abnormal amounts of bleeding, swelling, significant bruising, significant pain, or evidence of infection, please call the office immediately.  FOR ADULT SURGERY PATIENTS: If you need something for pain relief you may take 1 extra strength Tylenol (acetaminophen) AND 2 Ibuprofen (200mg each) together every 4 hours as needed for pain. (do not take these if you are allergic to them or if you have a reason you should not take them.) Typically, you may only need pain medication for 1 to 3 days.     Due to recent changes in healthcare laws, you may see results of your pathology and/or laboratory studies on MyChart before the doctors have had a chance to review them. We understand that in some cases there may be results that are confusing or concerning to you. Please understand that not all results are received at the same time and often the doctors may need to interpret multiple results in order to provide you with the best plan of care or course of treatment. Therefore, we ask that you please give us 2 business days to thoroughly review all your results before contacting the office for clarification. Should  we see a critical lab result, you will be contacted sooner.   If You Need Anything After Your Visit  If you have any questions or concerns for your doctor, please call our main line at [phone] and press option 4 to reach your doctor's medical assistant. If no one answers, please leave a voicemail as directed and we will return your call as soon as possible. Messages left after 4 pm will be answered the following business day.   You may also send us a message via MyChart. We typically respond to MyChart messages within 1-2 business days.  For prescription refills, please ask your pharmacy to contact our office. Our fax number is [phone].  If you have an urgent issue when the clinic is closed that cannot wait until the next business day, you can page your doctor at the number below.    Please note that while we do our best to be available for urgent issues outside of office hours, we are not available 24/7.   If you have an urgent issue and are unable to reach us, you may choose to seek medical care at your doctor's office, retail clinic, urgent care center, or emergency room.  If you have a medical emergency, please immediately call 911 or go to the emergency department.  Pager Numbers  - Dr. Kowalski: [phone]  - Dr. Moye: [phone]  - Dr. Stewart: [phone]  In the event of inclement weather, please call our main line at   [phone] for an update on the status of any delays or closures.  Dermatology Medication Tips: Please keep the boxes that topical medications come in in order to help keep track of the instructions about where and how to use these. Pharmacies typically print the medication instructions only on the boxes and not directly on the medication tubes.   If your medication is too expensive, please contact our office at [phone] option 4 or send us a message through MyChart.   We are unable to tell what your co-pay for medications will be in  advance as this is different depending on your insurance coverage. However, we may be able to find a substitute medication at lower cost or fill out paperwork to get insurance to cover a needed medication.   If a prior authorization is required to get your medication covered by your insurance company, please allow us 1-2 business days to complete this process.  Drug prices often vary depending on where the prescription is filled and some pharmacies may offer cheaper prices.  The website www.goodrx.com contains coupons for medications through different pharmacies. The prices here do not account for what the cost may be with help from insurance (it may be cheaper with your insurance), but the website can give you the price if you did not use any insurance.  - You can print the associated coupon and take it with your prescription to the pharmacy.  - You may also stop by our office during regular business hours and pick up a GoodRx coupon card.  - If you need your prescription sent electronically to a different pharmacy, notify our office through [facility] MyChart or by phone at [phone] option 4.     Si Usted Necesita Algo Despus de Su Visita  Tambin puede enviarnos un mensaje a travs de MyChart. Por lo general respondemos a los mensajes de MyChart en el transcurso de 1 a 2 das hbiles.  Para renovar recetas, por favor pida a su farmacia que se ponga en contacto con nuestra oficina. Nuestro nmero de fax es el [phone].  Si tiene un asunto urgente cuando la clnica est cerrada y que no puede esperar hasta el siguiente da hbil, puede llamar/localizar a su doctor(a) al nmero que aparece a continuacin.   Por favor, tenga en cuenta que aunque hacemos todo lo posible para estar disponibles para asuntos urgentes fuera del horario de oficina, no estamos disponibles las 24 horas del da, los 7 das de la semana.   Si tiene un problema urgente y no puede comunicarse con nosotros, puede  optar por buscar atencin mdica  en el consultorio de su doctor(a), en una clnica privada, en un centro de atencin urgente o en una sala de emergencias.  Si tiene una emergencia mdica, por favor llame inmediatamente al 911 o vaya a la sala de emergencias.  Nmeros de bper  - Dr. Kowalski: [phone]  - Dra. Moye: [phone]  - Dra. Stewart: [phone]  En caso de inclemencias del tiempo, por favor llame a nuestra lnea principal al [phone] para una actualizacin sobre el estado de cualquier retraso o cierre.  Consejos para la medicacin en dermatologa: Por favor, guarde las cajas en las que vienen los medicamentos de uso tpico para ayudarle a seguir las instrucciones sobre dnde y cmo usarlos. Las farmacias generalmente imprimen las instrucciones del medicamento slo en las cajas y no directamente en los tubos del medicamento.   Si su medicamento es muy caro, por favor, pngase en contacto con   nuestra oficina llamando al [phone] y presione la opcin 4 o envenos un mensaje a travs de MyChart.   No podemos decirle cul ser su copago por los medicamentos por adelantado ya que esto es diferente dependiendo de la cobertura de su seguro. Sin embargo, es posible que podamos encontrar un medicamento sustituto a menor costo o llenar un formulario para que el seguro cubra el medicamento que se considera necesario.   Si se requiere una autorizacin previa para que su compaa de seguros cubra su medicamento, por favor permtanos de 1 a 2 das hbiles para completar este proceso.  Los precios de los medicamentos varan con frecuencia dependiendo del lugar de dnde se surte la receta y alguna farmacias pueden ofrecer precios ms baratos.  El sitio web www.goodrx.com tiene cupones para medicamentos de diferentes farmacias. Los precios aqu no tienen en cuenta lo que podra costar con la ayuda del seguro (puede ser ms barato con su seguro), pero el sitio web puede darle el  precio si no utiliz ningn seguro.  - Puede imprimir el cupn correspondiente y llevarlo con su receta a la farmacia.  - Tambin puede pasar por nuestra oficina durante el horario de atencin regular y recoger una tarjeta de cupones de GoodRx.  - Si necesita que su receta se enve electrnicamente a una farmacia diferente, informe a nuestra oficina a travs de MyChart de [facility] o por telfono llamando al [phone] y presione la opcin 4.  

## 2022-06-12 ENCOUNTER — Encounter: Payer: Self-pay | Admitting: Dermatology

## 2022-06-12 ENCOUNTER — Telehealth: Payer: Self-pay

## 2022-06-12 ENCOUNTER — Ambulatory Visit
Admission: RE | Admit: 2022-06-12 | Discharge: 2022-06-12 | Disposition: A | Discharge status: Home / Self Care | Payer: Medicare Other | Source: Ambulatory Visit | Attending: Family Medicine | Admitting: Family Medicine

## 2022-06-12 ENCOUNTER — Ambulatory Visit
Admission: RE | Admit: 2022-06-12 | Discharge: 2022-06-12 | Disposition: A | Discharge status: Home / Self Care | Payer: Medicare Other | Attending: Family Medicine | Admitting: Family Medicine

## 2022-06-12 ENCOUNTER — Other Ambulatory Visit: Payer: Self-pay

## 2022-06-12 ENCOUNTER — Ambulatory Visit (INDEPENDENT_AMBULATORY_CARE_PROVIDER_SITE_OTHER): Payer: Medicare Other | Admitting: Family Medicine

## 2022-06-12 ENCOUNTER — Encounter: Payer: Self-pay | Admitting: Family Medicine

## 2022-06-12 VITALS — BP 120/78 | HR 76 | Ht 71.0 in | Wt 198.0 lb

## 2022-06-12 DIAGNOSIS — Z1211 Encounter for screening for malignant neoplasm of colon: Secondary | ICD-10-CM

## 2022-06-12 DIAGNOSIS — R051 Acute cough: Secondary | ICD-10-CM | POA: Insufficient documentation

## 2022-06-12 DIAGNOSIS — Z86010 Personal history of colonic polyps: Secondary | ICD-10-CM

## 2022-06-12 DIAGNOSIS — J4521 Mild intermittent asthma with (acute) exacerbation: Secondary | ICD-10-CM

## 2022-06-12 DIAGNOSIS — J0100 Acute maxillary sinusitis, unspecified: Secondary | ICD-10-CM | POA: Diagnosis not present

## 2022-06-12 LAB — POC COVID19 BINAXNOW: SARS Coronavirus 2 Ag: NEGATIVE

## 2022-06-12 MED ORDER — AZITHROMYCIN 250 MG PO TABS: ORAL_TABLET | ORAL | 0 refills | Status: AC

## 2022-06-12 MED ORDER — ALBUTEROL SULFATE HFA 108 (90 BASE) MCG/ACT IN AERS: 2.0000 | INHALATION_SPRAY | Freq: Four times a day (QID) | RESPIRATORY_TRACT | 2 refills | Status: DC | PRN

## 2022-06-12 MED ORDER — NA SULFATE-K SULFATE-MG SULF 17.5-3.13-1.6 GM/177ML PO SOLN: 1.0000 | Freq: Once | ORAL | 0 refills | Status: AC

## 2022-06-12 MED ORDER — BENZONATATE 100 MG PO CAPS: 100.0000 mg | ORAL_CAPSULE | Freq: Three times a day (TID) | ORAL | 0 refills | Status: DC | PRN

## 2022-06-12 NOTE — Patient Instructions (Signed)

## 2022-06-12 NOTE — Telephone Encounter (Signed)
Gastroenterology Pre-Procedure Review  Request Date: 07/07/22 Requesting Physician: Dr. Allen Norris  PATIENT REVIEW QUESTIONS: The patient responded to the following health history questions as indicated:    1. Are you having any GI issues? no 2. Do you have a personal history of Polyps? yes (08/2019 colonoscopy performed by Dr. Allen Norris polyps were noted) 3. Do you have a family history of Colon Cancer or Polyps? no 4. Diabetes Mellitus? no 5. Joint replacements in the past 12 months?no 6. Major health problems in the past 3 months?no 7. Any artificial heart valves, MVP, or defibrillator?no    MEDICATIONS & ALLERGIES:    Patient reports the following regarding taking any anticoagulation/antiplatelet therapy:   Plavix, Coumadin, Eliquis, Xarelto, Lovenox, Pradaxa, Brilinta, or Effient? no Aspirin? yes (81 mg daily)  Patient confirms/reports the following medications:  Current Outpatient Medications  Medication Sig Dispense Refill   acetaminophen (TYLENOL) 500 MG tablet Take 1,000 mg by mouth every 8 (eight) hours as needed for moderate pain.     albuterol (VENTOLIN HFA) 108 (90 Base) MCG/ACT inhaler Inhale 2 puffs into the lungs every 6 (six) hours as needed for wheezing or shortness of breath. 8 g 2   aspirin 81 MG chewable tablet Chew 81 mg by mouth daily.     azithromycin (ZITHROMAX) 250 MG tablet Take 2 tablets on day 1, then 1 tablet daily on days 2 through 5 6 tablet 0   benzonatate (TESSALON PERLES) 100 MG capsule Take 1 capsule (100 mg total) by mouth 3 (three) times daily as needed for cough. 20 capsule 0   cholecalciferol (VITAMIN D3) 25 MCG (1000 UNIT) tablet Take 1,000 Units by mouth daily.     metoprolol succinate (TOPROL-XL) 25 MG 24 hr tablet Take 25 mg by mouth daily.     nitroGLYCERIN (NITROSTAT) 0.4 MG SL tablet Place 0.4 mg under the tongue every 5 (five) minutes as needed for chest pain.     Omega-3 1000 MG CAPS Take 1,000 mg by mouth daily.     rosuvastatin (CRESTOR) 5 MG  tablet Take 5 mg by mouth every other day.     No current facility-administered medications for this visit.    Patient confirms/reports the following allergies:  Allergies  Allergen Reactions   Ezetimibe     Other reaction(s): Muscle Pain & joint pain   Atorvastatin Rash    myalgias    No orders of the defined types were placed in this encounter.   AUTHORIZATION INFORMATION Primary Insurance: 1D#: Group #:  Secondary Insurance: 1D#: Group #:  SCHEDULE INFORMATION: Date: 07/07/22 Time: Location: [place]

## 2022-06-12 NOTE — Progress Notes (Signed)
Date:  06/12/2022   Name:  Darren Page   DOB:  1956-03-25   MRN:  [id number]   Chief Complaint: Cough (Yellow production)  Cough This is a new problem. The current episode started 1 to 4 weeks ago (10 days). The problem has been waxing and waning. The cough is Productive of purulent sputum (yellow). Associated symptoms include nasal congestion, postnasal drip and rhinorrhea. Pertinent negatives include no chest pain, fever, sore throat, shortness of breath or wheezing. The symptoms are aggravated by lying down. Risk factors for lung disease include smoking/tobacco exposure. He has tried OTC cough suppressant for the symptoms. His past medical history is significant for emphysema.    Lab Results  Component Value Date   NA 137 09/05/2020   K 4.5 09/05/2020   CO2 26 09/05/2020   GLUCOSE 125 (H) 09/05/2020   BUN 29 (H) 09/05/2020   CREATININE 1.32 (H) 09/05/2020   CALCIUM 9.2 09/05/2020   GFRNONAA 59 (L) 09/05/2020   No results found for: "CHOL", "HDL", "LDLCALC", "LDLDIRECT", "TRIG", "CHOLHDL" No results found for: "TSH" No results found for: "HGBA1C" Lab Results  Component Value Date   WBC 9.1 09/05/2020   HGB 17.9 (H) 09/05/2020   HCT 52.8 (H) 09/05/2020   MCV 94.8 09/05/2020   PLT 239 09/05/2020   Lab Results  Component Value Date   ALT 23 09/05/2020   AST 29 09/05/2020   ALKPHOS 68 09/05/2020   BILITOT 0.5 09/05/2020   No results found for: "25OHVITD2", "25OHVITD3", "VD25OH"   Review of Systems  Constitutional:  Negative for fever.  HENT:  Positive for postnasal drip, rhinorrhea and sinus pressure. Negative for sore throat.   Respiratory:  Positive for cough. Negative for chest tightness, shortness of breath and wheezing.   Cardiovascular:  Negative for chest pain and palpitations.    Patient Active Problem List   Diagnosis Date Noted   Positive fecal immunochemical test    Polyp of transverse colon    Abnormal stress test 02/03/2018   Pain in  both upper extremities 02/03/2018   Coronary artery disease 11/22/2012   Hypercholesterolemia 11/22/2012   Tobacco use disorder 11/22/2012    Allergies  Allergen Reactions   Ezetimibe     Other reaction(s): Muscle Pain & joint pain   Atorvastatin Rash    myalgias    Past Surgical History:  Procedure Laterality Date   COLONOSCOPY WITH PROPOFOL N/A 08/23/2019   Procedure: COLONOSCOPY WITH PROPOFOL;  Surgeon: Lucilla Lame, MD;  Location: [facility] ENDOSCOPY;  Service: Endoscopy;  Laterality: N/A;  priority 3   CYSTOSCOPY/URETEROSCOPY/HOLMIUM LASER/STENT PLACEMENT Right 09/14/2020   Procedure: CYSTOSCOPY/URETEROSCOPY/HOLMIUM LASER/STENT PLACEMENT;  Surgeon: [name], MD;  Location: [facility] ORS;  Service: Urology;  Laterality: Right;   SKIN CANCER EXCISION     mohs nose    Social History   Tobacco Use   Smoking status: Every Day    Packs/day: 1.00    Years: 50.00    Total pack years: 50.00    Types: Cigarettes    Last attempt to quit: 02/03/2018    Years since quitting: 4.3   Smokeless tobacco: Never   Tobacco comments:    start back on patches  Vaping Use   Vaping Use: Never used  Substance Use Topics   Alcohol use: Yes    Alcohol/week: 1.0 standard drink of alcohol    Types: 1 Cans of beer per week    Comment: occasionally    Drug use: No  Medication list has been reviewed and updated.  Current Meds  Medication Sig   acetaminophen (TYLENOL) 500 MG tablet Take 1,000 mg by mouth every 8 (eight) hours as needed for moderate pain.   aspirin 81 MG chewable tablet Chew 81 mg by mouth daily.   cholecalciferol (VITAMIN D3) 25 MCG (1000 UNIT) tablet Take 1,000 Units by mouth daily.   metoprolol succinate (TOPROL-XL) 25 MG 24 hr tablet Take 25 mg by mouth daily.   nitroGLYCERIN (NITROSTAT) 0.4 MG SL tablet Place 0.4 mg under the tongue every 5 (five) minutes as needed for chest pain.   Omega-3 1000 MG CAPS Take 1,000 mg by mouth daily.   rosuvastatin (CRESTOR) 5 MG tablet  Take 5 mg by mouth every other day.       06/12/2022    9:29 AM 08/28/2021    9:50 AM 04/18/2021    9:32 AM 02/26/2021    4:08 PM  GAD 7 : Generalized Anxiety Score  Nervous, Anxious, on Edge 0 0 0 0  Control/stop worrying 0 0 0 0  Worry too much - different things 0 0 0 0  Trouble relaxing 0 0 0 0  Restless 0 0 0 0  Easily annoyed or irritable 0 0 0 0  Afraid - awful might happen 0 0 0 0  Total GAD 7 Score 0 0 0 0  Anxiety Difficulty Not difficult at all Not difficult at all Not difficult at all        06/12/2022    9:29 AM 10/21/2021    1:07 PM 08/28/2021    9:50 AM  Depression screen PHQ 2/9  Decreased Interest 0 0 0  Down, Depressed, Hopeless 0 0 0  PHQ - 2 Score 0 0 0  Altered sleeping 0  0  Tired, decreased energy 0  0  Change in appetite 0  0  Feeling bad or failure about yourself  0  0  Trouble concentrating 0  0  Moving slowly or fidgety/restless 0  0  Suicidal thoughts 0  0  PHQ-9 Score 0  0  Difficult doing work/chores Not difficult at all  Not difficult at all    BP Readings from Last 3 Encounters:  06/12/22 120/78  06/11/22 (!) 142/98  08/28/21 110/80    Physical Exam HENT:     Head: Normocephalic.     Right Ear: External ear normal.     Left Ear: External ear normal.     Nose: Nose normal.     Right Turbinates: Swollen.     Left Turbinates: Swollen.     Right Sinus: No maxillary sinus tenderness or frontal sinus tenderness.     Left Sinus: No maxillary sinus tenderness or frontal sinus tenderness.     Mouth/Throat:     Lips: Pink.     Mouth: Mucous membranes are moist.     Pharynx: No posterior oropharyngeal erythema.  Eyes:     Conjunctiva/sclera: Conjunctivae normal.     Pupils: Pupils are equal, round, and reactive to light.  Cardiovascular:     Rate and Rhythm: Normal rate and regular rhythm.     Heart sounds: Normal heart sounds, S1 normal and S2 normal. No murmur heard.    No systolic murmur is present.     No diastolic murmur is  present.     No friction rub. No gallop. No S3 or S4 sounds.  Pulmonary:     Effort: No respiratory distress.     Breath sounds: Decreased  air movement present. No decreased breath sounds, wheezing, rhonchi or rales.  Abdominal:     General: Bowel sounds are normal.     Palpations: Abdomen is soft. There is no hepatomegaly, splenomegaly or mass.     Tenderness: There is no abdominal tenderness. There is no guarding or rebound.  Musculoskeletal:        General: No tenderness. Normal range of motion.     Cervical back: Normal range of motion and neck supple.  Lymphadenopathy:     Cervical: No cervical adenopathy.  Skin:    General: Skin is warm.     Findings: No rash.  Neurological:     Mental Status: He is alert.     Wt Readings from Last 3 Encounters:  06/12/22 198 lb (89.8 kg)  08/28/21 197 lb (89.4 kg)  04/18/21 196 lb (88.9 kg)    BP 120/78   Pulse 76   Ht '5\' 11"'$  (1.803 m)   Wt 198 lb (89.8 kg)   SpO2 96%   BMI 27.62 kg/m   Assessment and Plan:  1. Acute cough New onset.  Persistent.  Stable.  Patient has had it since around the holiday.  Which she associated with something going through the family.  COVID test was done and it was negative.  Patient has been taken over-the-counter preparations for cough and we will add Tessalon Perles to the on a as needed basis every 12 hours.   - DG Chest 2 View - POC COVID-19 - benzonatate (TESSALON PERLES) 100 MG capsule; Take 1 capsule (100 mg total) by mouth 3 (three) times daily as needed for cough.  Dispense: 20 capsule; Refill: 0 - albuterol (VENTOLIN HFA) 108 (90 Base) MCG/ACT inhaler; Inhale 2 puffs into the lungs every 6 (six) hours as needed for wheezing or shortness of breath.  Dispense: 8 g; Refill: 2  2. Mild intermittent reactive airway disease with acute exacerbation Chronic.  Persistent.  Likely of a COPD nature with decreased air movement generalized.  We will initiate albuterol inhaler that he has had in the past  2 puffs every 6-8 hours as needed.  Incidentally patient was offered screening for lung cancer with low-dose chest CT but he patient has discomfort in the maxillary sinuses bilateral with postnasal drainage declined. - albuterol (VENTOLIN HFA) 108 (90 Base) MCG/ACT inhaler; Inhale 2 puffs into the lungs every 6 (six) hours as needed for wheezing or shortness of breath.  Dispense: 8 g; Refill: 2  3. Colon cancer screening New onset.  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 - Ambulatory referral to Gastroenterology  4. Acute non-recurrent maxillary sinusitis Patient has pressure in the maxillary sinuses bilateral which is worse with certain positions particularly supine.  Exam is consistent with sinusitis and we will treat with azithromycin to 50 mg 2 today followed by 4-day single dosing. - azithromycin (ZITHROMAX) 250 MG tablet; Take 2 tablets on day 1, then 1 tablet daily on days 2 through 5  Dispense: 6 tablet; Refill: 0    Otilio Miu, MD

## 2022-06-16 ENCOUNTER — Telehealth: Payer: Self-pay

## 2022-06-16 NOTE — Telephone Encounter (Signed)
-----  Message from Ralene Bathe, MD sent at 06/16/2022  9:56 AM EST ----- Diagnosis Skin , sternum PIGMENTED SEBORRHEIC KERATOSIS  Benign keratosis Recheck next visit No further treatment needed at this time.

## 2022-06-16 NOTE — Telephone Encounter (Signed)
LM on VM please return my call  

## 2022-06-19 ENCOUNTER — Telehealth: Payer: Self-pay

## 2022-06-19 NOTE — Telephone Encounter (Signed)
Pt phone call returned to reschedule his colonoscopy with Dr. Allen Norris.  His colonoscopy has been rescheduled to 09/01/22.  Kim at [facility] has been informed of his new date.  Instructions updated. Referral noted.  Thanks,  [place], [place]

## 2022-06-20 ENCOUNTER — Telehealth: Payer: Self-pay

## 2022-06-20 ENCOUNTER — Telehealth: Payer: Self-pay | Admitting: Family Medicine

## 2022-06-20 NOTE — Telephone Encounter (Signed)
Copied from [place] [id number]. Topic: General - Other >> Jun 20, 2022 10:03 AM Cyndi Bender wrote: Reason for CRM: Pt requests call back to go over his x-ray results. Cb# [phone]

## 2022-06-20 NOTE — Telephone Encounter (Signed)
Called with no active cardiopulmonary disease

## 2022-06-23 NOTE — Telephone Encounter (Signed)
Advised pt of bx results/sh ?

## 2022-06-23 NOTE — Telephone Encounter (Signed)
Pt lvm requesting to cancel his colonoscopy with Dr.Wohl scheduled for 09/01/22 due to his busy schedule.  He said he will call back to reschedule later.  Secure staff message and voice message has been left to notifiy Debbie and Kim at [facility] of patients cancellation.  Thanks [place], [place]

## 2022-09-01 ENCOUNTER — Ambulatory Visit: Admit: 2022-09-01 | Payer: Medicare Other | Admitting: Gastroenterology

## 2022-09-01 SURGERY — COLONOSCOPY WITH PROPOFOL
Anesthesia: Choice

## 2022-10-01 ENCOUNTER — Telehealth: Payer: Self-pay | Admitting: Family Medicine

## 2022-10-01 NOTE — Telephone Encounter (Signed)
Contacted Darren Page to schedule their annual wellness visit. Appointment made for 10/29/2022.  Verlee Rossetti; Care Guide Ambulatory Clinical Support [place] [facility] Direct Dial: [phone]

## 2022-10-29 ENCOUNTER — Ambulatory Visit (INDEPENDENT_AMBULATORY_CARE_PROVIDER_SITE_OTHER): Payer: Medicare Other

## 2022-10-29 VITALS — Ht 71.0 in | Wt 198.0 lb

## 2022-10-29 DIAGNOSIS — Z0000 Encounter for general adult medical examination without abnormal findings: Secondary | ICD-10-CM

## 2022-10-29 NOTE — Progress Notes (Signed)
I connected with  Darren Page on 10/29/22 by a audio enabled telemedicine application and verified that I am speaking with the correct person using two identifiers.  Patient Location: Home  Provider Location: Office/Clinic  I discussed the limitations of evaluation and management by telemedicine. The patient expressed understanding and agreed to proceed.  Subjective:   Darren Page is a 67 y.o. male who presents for Medicare Annual/Subsequent preventive examination.  Review of Systems     Cardiac Risk Factors include: advanced age (>23men, >51 women);male gender;dyslipidemia     Objective:    There were no vitals filed for this visit. There is no height or weight on file to calculate BMI.     10/29/2022   10:00 AM 10/21/2021    1:08 PM 09/13/2020   11:28 AM 09/05/2020    2:38 PM 07/31/2018    9:25 AM  Advanced Directives  Does Patient Have a Medical Advance Directive? No No No No No  Would patient like information on creating a medical advance directive? No - Patient declined Yes (MAU/Ambulatory/Procedural Areas - Information given)       Current Medications (verified) Outpatient Encounter Medications as of 10/29/2022  Medication Sig   acetaminophen (TYLENOL) 500 MG tablet Take 1,000 mg by mouth every 8 (eight) hours as needed for moderate pain.   aspirin 81 MG chewable tablet Chew 81 mg by mouth daily.   cholecalciferol (VITAMIN D3) 25 MCG (1000 UNIT) tablet Take 1,000 Units by mouth daily.   metoprolol succinate (TOPROL-XL) 25 MG 24 hr tablet Take 25 mg by mouth daily.   nitroGLYCERIN (NITROSTAT) 0.4 MG SL tablet Place 0.4 mg under the tongue every 5 (five) minutes as needed for chest pain.   Omega-3 1000 MG CAPS Take 1,000 mg by mouth daily.   rosuvastatin (CRESTOR) 5 MG tablet Take 5 mg by mouth every other day.   albuterol (VENTOLIN HFA) 108 (90 Base) MCG/ACT inhaler Inhale 2 puffs into the lungs every 6 (six) hours as needed for wheezing or shortness  of breath.   benzonatate (TESSALON PERLES) 100 MG capsule Take 1 capsule (100 mg total) by mouth 3 (three) times daily as needed for cough. (Patient not taking: Reported on 10/29/2022)   No facility-administered encounter medications on file as of 10/29/2022.    Allergies (verified) Ezetimibe and Atorvastatin   History: Past Medical History:  Diagnosis Date   Aneurysm of aorta (HCC)    Basal cell carcinoma 2018   dorsum nose, Excised by Dr. 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/2003   History of kidney stones    History of sebaceous cyst 07/19/2017   Left shoulder   Hyperlipidemia    Hypertension    Past Surgical History:  Procedure Laterality Date   COLONOSCOPY WITH PROPOFOL N/A 08/23/2019   Procedure: COLONOSCOPY WITH PROPOFOL;  Surgeon: Midge Minium, MD;  Location: [facility] ENDOSCOPY;  Service: Endoscopy;  Laterality: N/A;  priority 3   CYSTOSCOPY/URETEROSCOPY/HOLMIUM LASER/STENT PLACEMENT Right 09/14/2020   Procedure: CYSTOSCOPY/URETEROSCOPY/HOLMIUM LASER/STENT PLACEMENT;  Surgeon: Sondra Come, MD;  Location: [facility] ORS;  Service: Urology;  Laterality: Right;   SKIN CANCER EXCISION     mohs nose   Family History  Problem Relation Age of Onset   Cancer Mother    Healthy Father    Prostate cancer Father    Social History   Socioeconomic History   Marital status: Married  Spouse name: Not on file   Number of children: Not on file   Years of education: Not on file   Highest education level: Not on file  Occupational History   Not on file  Tobacco Use   Smoking status: Every Day    Packs/day: 1.00    Years: 50.00    Additional pack years: 0.00    Total pack years: 50.00    Types: Cigarettes    Last attempt to quit: 02/03/2018    Years since quitting: 4.7   Smokeless tobacco: Never   Tobacco comments:    start back on patches  Vaping Use   Vaping Use: Never used  Substance  and Sexual Activity   Alcohol use: Yes    Alcohol/week: 1.0 standard drink of alcohol    Types: 1 Cans of beer per week    Comment: occasionally    Drug use: No   Sexual activity: Not Currently  Other Topics Concern   Not on file  Social History Narrative   Not on file   Social Determinants of Health   Financial Resource Strain: Low Risk  (10/29/2022)   Overall Financial Resource Strain (CARDIA)    Difficulty of Paying Living Expenses: Not hard at all  Food Insecurity: No Food Insecurity (10/29/2022)   Hunger Vital Sign    Worried About Running Out of Food in the Last Year: Never true    Ran Out of Food in the Last Year: Never true  Transportation Needs: No Transportation Needs (10/29/2022)   PRAPARE - Administrator, Civil Service (Medical): No    Lack of Transportation (Non-Medical): No  Physical Activity: Sufficiently Active (10/29/2022)   Exercise Vital Sign    Days of Exercise per Week: 4 days    Minutes of Exercise per Session: 60 min  Stress: No Stress Concern Present (10/29/2022)   [employer] of Occupational Health - Occupational Stress Questionnaire    Feeling of Stress : Not at all  Social Connections: Socially Integrated (10/29/2022)   Social Connection and Isolation Panel [NHANES]    Frequency of Communication with Friends and Family: More than three times a week    Frequency of Social Gatherings with Friends and Family: More than three times a week    Attends Religious Services: More than 4 times per year    Active Member of [employer] or Organizations: Yes    Attends Engineer, structural: More than 4 times per year    Marital Status: Married    Tobacco Counseling Ready to quit: Not Answered Counseling given: Not Answered Tobacco comments: start back on patches   Clinical Intake:  Pre-visit preparation completed: Yes  Pain : No/denies pain     Nutritional Risks: None Diabetes: No  How often do you need to have someone help you  when you read instructions, pamphlets, or other written materials from your doctor or pharmacy?: 1 - Never  Diabetic?no  Interpreter Needed?: No  Information entered by :: Kennedy Bucker, LPN   Activities of Daily Living    10/29/2022   10:01 AM 10/28/2022   10:38 AM  In your present state of health, do you have any difficulty performing the following activities:  Hearing? 0 0  Vision? 0 0  Difficulty concentrating or making decisions? 0   Walking or climbing stairs? 0 0  Dressing or bathing? 0 0  Doing errands, shopping? 0 0  Preparing Food and eating ? N N  Using the Toilet? N N  In the past six months, have you accidently leaked urine? N N  Do you have problems with loss of bowel control? N N  Managing your Medications? N N  Managing your Finances? N N  Housekeeping or managing your Housekeeping? N N    Patient Care Team: Duanne Limerick, MD as PCP - General (Family Medicine)  Indicate any recent Medical Services you may have received from other than [facility] providers in the past year (date may be approximate).     Assessment:   This is a routine wellness examination for Darren Page.  Hearing/Vision screen Hearing Screening - Comments:: Wears aids Vision Screening - Comments:: Readers-Dr.Richardson  Dietary issues and exercise activities discussed: Current Exercise Habits: Home exercise routine, Type of exercise: walking, Time (Minutes): 60, Frequency (Times/Week): 4, Weekly Exercise (Minutes/Week): 240, Intensity: Mild, Exercise limited by: None identified   Goals Addressed             This Visit's Progress    DIET - EAT MORE FRUITS AND VEGETABLES         Depression Screen    10/29/2022    9:59 AM 06/12/2022    9:29 AM 10/21/2021    1:07 PM 08/28/2021    9:50 AM 04/18/2021    9:31 AM 02/26/2021    4:07 PM 07/12/2019    3:06 PM  PHQ 2/9 Scores  PHQ - 2 Score 0 0 0 0 0 0 0  PHQ- 9 Score 0 0  0 0 0 1    Fall Risk    10/29/2022   10:01 AM 10/28/2022   10:38 AM  06/12/2022    9:28 AM 10/21/2021    1:09 PM 10/21/2021   10:55 AM  Fall Risk   Falls in the past year? 0 0 0 0 0  Number falls in past yr: 0 0 0 0 0  Injury with Fall? 0 0 0 0 0  Risk for fall due to : No Fall Risks  No Fall Risks No Fall Risks   Follow up Falls prevention discussed;Falls evaluation completed  Falls evaluation completed Falls prevention discussed     FALL RISK PREVENTION PERTAINING TO THE HOME:  Any stairs in or around the home? Yes  If so, are there any without handrails? No  Home free of loose throw rugs in walkways, pet beds, electrical cords, etc? Yes  Adequate lighting in your home to reduce risk of falls? Yes   ASSISTIVE DEVICES UTILIZED TO PREVENT FALLS:  Life alert? No  Use of a cane, walker or w/c? No  Grab bars in the bathroom? No  Shower chair or bench in shower? No  Elevated toilet seat or a handicapped toilet? No   Cognitive Function:        10/29/2022   10:05 AM  6CIT Screen  What Year? 0 points  What month? 0 points  What time? 0 points  Count back from 20 0 points  Months in reverse 0 points  Repeat phrase 0 points  Total Score 0 points    Immunizations  There is no immunization history on file for this patient.  TDAP status: Due, Education has been provided regarding the importance of this vaccine. Advised may receive this vaccine at local pharmacy or Health Dept. Aware to provide a copy of the vaccination record if obtained from local pharmacy or Health Dept. Verbalized acceptance and understanding.  Flu Vaccine status: Declined, Education has been provided regarding the importance of this vaccine but patient still declined. Advised  may receive this vaccine at local pharmacy or Health Dept. Aware to provide a copy of the vaccination record if obtained from local pharmacy or Health Dept. Verbalized acceptance and understanding.  Pneumococcal vaccine status: Declined,  Education has been provided regarding the importance of this vaccine  but patient still declined. Advised may receive this vaccine at local pharmacy or Health Dept. Aware to provide a copy of the vaccination record if obtained from local pharmacy or Health Dept. Verbalized acceptance and understanding.   Covid-19 vaccine status: Declined, Education has been provided regarding the importance of this vaccine but patient still declined. Advised may receive this vaccine at local pharmacy or Health Dept.or vaccine clinic. Aware to provide a copy of the vaccination record if obtained from local pharmacy or Health Dept. Verbalized acceptance and understanding.  Qualifies for Shingles Vaccine? Yes   Zostavax completed No   Shingrix Completed?: No.    Education has been provided regarding the importance of this vaccine. Patient has been advised to call insurance company to determine out of pocket expense if they have not yet received this vaccine. Advised may also receive vaccine at local pharmacy or Health Dept. Verbalized acceptance and understanding.  Screening Tests Health Maintenance  Topic Date Due   COVID-19 Vaccine (1) Never done   Pneumonia Vaccine 64+ Years old (1 of 2 - PCV) Never done   Zoster Vaccines- Shingrix (1 of 2) Never done   Colonoscopy  08/22/2020   Lung Cancer Screening  06/13/2023 (Originally 04/01/2004)   Hepatitis C Screening  06/13/2023 (Originally 04/01/1974)   INFLUENZA VACCINE  01/01/2023   Medicare Annual Wellness (AWV)  10/29/2023   HPV VACCINES  Aged Out   DTaP/Tdap/Td  Discontinued    Health Maintenance  Health Maintenance Due  Topic Date Due   COVID-19 Vaccine (1) Never done   Pneumonia Vaccine 28+ Years old (1 of 2 - PCV) Never done   Zoster Vaccines- Shingrix (1 of 2) Never done   Colonoscopy  08/22/2020    Declined referral for colonoscopy  Lung Cancer Screening: (Low Dose CT Chest recommended if Age 37-80 years, 30 pack-year currently smoking OR have quit w/in 15years.) does not qualify.   Additional  Screening:  Hepatitis C Screening: does qualify; Completed no  Vision Screening: Recommended annual ophthalmology exams for early detection of glaucoma and other disorders of the eye. Is the patient up to date with their annual eye exam?  Yes  Who is the provider or what is the name of the office in which the patient attends annual eye exams? Dr.Richardson If pt is not established with a provider, would they like to be referred to a provider to establish care? No .   Dental Screening: Recommended annual dental exams for proper oral hygiene  Community Resource Referral / Chronic Care Management: CRR required this visit?  No   CCM required this visit?  No      Plan:     I have personally reviewed and noted the following in the patient's chart:   Medical and social history Use of alcohol, tobacco or illicit drugs  Current medications and supplements including opioid prescriptions. Patient is not currently taking opioid prescriptions. Functional ability and status Nutritional status Physical activity Advanced directives List of other physicians Hospitalizations, surgeries, and ER visits in previous 12 months Vitals Screenings to include cognitive, depression, and falls Referrals and appointments  In addition, I have reviewed and discussed with patient certain preventive protocols, quality metrics, and best practice recommendations. A  written personalized care plan for preventive services as well as general preventive health recommendations were provided to patient.     Hal Hope, LPN   [id number]   Nurse Notes: none

## 2022-10-29 NOTE — Patient Instructions (Signed)
[name] , Thank you for taking time to come for your Medicare Wellness Visit. I appreciate your ongoing commitment to your health goals. Please review the following plan we discussed and let me know if I can assist you in the future.   These are the goals we discussed:  Goals      DIET - EAT MORE FRUITS AND VEGETABLES        This is a list of the screening recommended for you and due dates:  Health Maintenance  Topic Date Due   COVID-19 Vaccine (1) Never done   Pneumonia Vaccine (1 of 2 - PCV) Never done   Zoster (Shingles) Vaccine (1 of 2) Never done   Colon Cancer Screening  08/22/2020   Screening for Lung Cancer  06/13/2023*   Hepatitis C Screening  06/13/2023*   Flu Shot  01/01/2023   Medicare Annual Wellness Visit  10/29/2023   HPV Vaccine  Aged Out   DTaP/Tdap/Td vaccine  Discontinued  *Topic was postponed. The date shown is not the original due date.    Advanced directives: no  Conditions/risks identified: none  Next appointment: Follow up in one year for your annual wellness visit. 11/04/23 @ 9:45 am by phone  Preventive Care 65 Years and Older, Male  Preventive care refers to lifestyle choices and visits with your health care provider that can promote health and wellness. What does preventive care include? A yearly physical exam. This is also called an annual well check. Dental exams once or twice a year. Routine eye exams. Ask your health care provider how often you should have your eyes checked. Personal lifestyle choices, including: Daily care of your teeth and gums. Regular physical activity. Eating a healthy diet. Avoiding tobacco and drug use. Limiting alcohol use. Practicing safe sex. Taking low doses of aspirin every day. Taking vitamin and mineral supplements as recommended by your health care provider. What happens during an annual well check? The services and screenings done by your health care provider during your annual well check will depend  on your age, overall health, lifestyle risk factors, and family history of disease. Counseling  Your health care provider may ask you questions about your: Alcohol use. Tobacco use. Drug use. Emotional well-being. Home and relationship well-being. Sexual activity. Eating habits. History of falls. Memory and ability to understand (cognition). Work and work Astronomer. Screening  You may have the following tests or measurements: Height, weight, and BMI. Blood pressure. Lipid and cholesterol levels. These may be checked every 5 years, or more frequently if you are over 6 years old. Skin check. Lung cancer screening. You may have this screening every year starting at age 83 if you have a 30-pack-year history of smoking and currently smoke or have quit within the past 15 years. Fecal occult blood test (FOBT) of the stool. You may have this test every year starting at age 27. Flexible sigmoidoscopy or colonoscopy. You may have a sigmoidoscopy every 5 years or a colonoscopy every 10 years starting at age 34. Prostate cancer screening. Recommendations will vary depending on your family history and other risks. Hepatitis C blood test. Hepatitis B blood test. Sexually transmitted disease (STD) testing. Diabetes screening. This is done by checking your blood sugar (glucose) after you have not eaten for a while (fasting). You may have this done every 1-3 years. Abdominal aortic aneurysm (AAA) screening. You may need this if you are a current or former smoker. Osteoporosis. You may be screened starting at age 60  if you are at high risk. Talk with your health care provider about your test results, treatment options, and if necessary, the need for more tests. Vaccines  Your health care provider may recommend certain vaccines, such as: Influenza vaccine. This is recommended every year. Tetanus, diphtheria, and acellular pertussis (Tdap, Td) vaccine. You may need a Td booster every 10  years. Zoster vaccine. You may need this after age 33. Pneumococcal 13-valent conjugate (PCV13) vaccine. One dose is recommended after age 4. Pneumococcal polysaccharide (PPSV23) vaccine. One dose is recommended after age 24. Talk to your health care provider about which screenings and vaccines you need and how often you need them. This information is not intended to replace advice given to you by your health care provider. Make sure you discuss any questions you have with your health care provider. Document Released: 06/15/2015 Document Revised: 02/06/2016 Document Reviewed: 03/20/2015 Elsevier Interactive Patient Education  2017 [employer].  Fall Prevention in the Home Falls can cause injuries. They can happen to people of all ages. There are many things you can do to make your home safe and to help prevent falls. What can I do on the outside of my home? Regularly fix the edges of walkways and driveways and fix any cracks. Remove anything that might make you trip as you walk through a door, such as a raised step or threshold. Trim any bushes or trees on the path to your home. Use bright outdoor lighting. Clear any walking paths of anything that might make someone trip, such as rocks or tools. Regularly check to see if handrails are loose or broken. Make sure that both sides of any steps have handrails. Any raised decks and porches should have guardrails on the edges. Have any leaves, snow, or ice cleared regularly. Use sand or salt on walking paths during winter. Clean up any spills in your garage right away. This includes oil or grease spills. What can I do in the bathroom? Use night lights. Install grab bars by the toilet and in the tub and shower. Do not use towel bars as grab bars. Use non-skid mats or decals in the tub or shower. If you need to sit down in the shower, use a plastic, non-slip stool. Keep the floor dry. Clean up any water that spills on the floor as soon as it  happens. Remove soap buildup in the tub or shower regularly. Attach bath mats securely with double-sided non-slip rug tape. Do not have throw rugs and other things on the floor that can make you trip. What can I do in the bedroom? Use night lights. Make sure that you have a light by your bed that is easy to reach. Do not use any sheets or blankets that are too big for your bed. They should not hang down onto the floor. Have a firm chair that has side arms. You can use this for support while you get dressed. Do not have throw rugs and other things on the floor that can make you trip. What can I do in the kitchen? Clean up any spills right away. Avoid walking on wet floors. Keep items that you use a lot in easy-to-reach places. If you need to reach something above you, use a strong step stool that has a grab bar. Keep electrical cords out of the way. Do not use floor polish or wax that makes floors slippery. If you must use wax, use non-skid floor wax. Do not have throw rugs and other things  on the floor that can make you trip. What can I do with my stairs? Do not leave any items on the stairs. Make sure that there are handrails on both sides of the stairs and use them. Fix handrails that are broken or loose. Make sure that handrails are as long as the stairways. Check any carpeting to make sure that it is firmly attached to the stairs. Fix any carpet that is loose or worn. Avoid having throw rugs at the top or bottom of the stairs. If you do have throw rugs, attach them to the floor with carpet tape. Make sure that you have a light switch at the top of the stairs and the bottom of the stairs. If you do not have them, ask someone to add them for you. What else can I do to help prevent falls? Wear shoes that: Do not have high heels. Have rubber bottoms. Are comfortable and fit you well. Are closed at the toe. Do not wear sandals. If you use a stepladder: Make sure that it is fully opened.  Do not climb a closed stepladder. Make sure that both sides of the stepladder are locked into place. Ask someone to hold it for you, if possible. Clearly mark and make sure that you can see: Any grab bars or handrails. First and last steps. Where the edge of each step is. Use tools that help you move around (mobility aids) if they are needed. These include: Canes. Walkers. Scooters. Crutches. Turn on the lights when you go into a dark area. Replace any light bulbs as soon as they burn out. Set up your furniture so you have a clear path. Avoid moving your furniture around. If any of your floors are uneven, fix them. If there are any pets around you, be aware of where they are. Review your medicines with your doctor. Some medicines can make you feel dizzy. This can increase your chance of falling. Ask your doctor what other things that you can do to help prevent falls. This information is not intended to replace advice given to you by your health care provider. Make sure you discuss any questions you have with your health care provider. Document Released: 03/15/2009 Document Revised: 10/25/2015 Document Reviewed: 06/23/2014 Elsevier Interactive Patient Education  2017 [employer].

## 2023-01-27 ENCOUNTER — Ambulatory Visit (INDEPENDENT_AMBULATORY_CARE_PROVIDER_SITE_OTHER): Payer: Medicare Other | Admitting: Family Medicine

## 2023-01-27 ENCOUNTER — Encounter: Payer: Self-pay | Admitting: Family Medicine

## 2023-01-27 VITALS — BP 112/74 | HR 56 | Ht 71.0 in | Wt 194.0 lb

## 2023-01-27 DIAGNOSIS — J0100 Acute maxillary sinusitis, unspecified: Secondary | ICD-10-CM | POA: Diagnosis not present

## 2023-01-27 MED ORDER — AMOXICILLIN-POT CLAVULANATE 875-125 MG PO TABS: 1.0000 | ORAL_TABLET | Freq: Two times a day (BID) | ORAL | 0 refills | Status: DC

## 2023-01-27 NOTE — Progress Notes (Signed)
Date:  01/27/2023   Name:  Darren Page   DOB:  06-11-1955   MRN:  [id number]   Chief Complaint: Cough (Nasal drainage, pressure around eyes, productive cough- clear production, no fever or chills)  Cough This is a new problem. The current episode started in the past 7 days. The problem has been gradually worsening. The cough is Non-productive. Associated symptoms include nasal congestion and postnasal drip. Pertinent negatives include no chest pain, chills, ear congestion, ear pain, fever, headaches, rhinorrhea, sore throat, shortness of breath or wheezing. Treatments tried: decongestant/robitussin dm. The treatment provided no relief.    Lab Results  Component Value Date   NA 137 09/05/2020   K 4.5 09/05/2020   CO2 26 09/05/2020   GLUCOSE 125 (H) 09/05/2020   BUN 29 (H) 09/05/2020   CREATININE 1.32 (H) 09/05/2020   CALCIUM 9.2 09/05/2020   GFRNONAA 59 (L) 09/05/2020   No results found for: "CHOL", "HDL", "LDLCALC", "LDLDIRECT", "TRIG", "CHOLHDL" No results found for: "TSH" No results found for: "HGBA1C" Lab Results  Component Value Date   WBC 9.1 09/05/2020   HGB 17.9 (H) 09/05/2020   HCT 52.8 (H) 09/05/2020   MCV 94.8 09/05/2020   PLT 239 09/05/2020   Lab Results  Component Value Date   ALT 23 09/05/2020   AST 29 09/05/2020   ALKPHOS 68 09/05/2020   BILITOT 0.5 09/05/2020   No results found for: "25OHVITD2", "25OHVITD3", "VD25OH"   Review of Systems  Constitutional:  Negative for chills and fever.  HENT:  Positive for postnasal drip and sinus pressure. Negative for ear pain, rhinorrhea and sore throat.   Respiratory:  Positive for cough. Negative for shortness of breath and wheezing.   Cardiovascular:  Negative for chest pain, palpitations and leg swelling.  Neurological:  Negative for headaches.    Patient Active Problem List   Diagnosis Date Noted   Positive fecal immunochemical test    Polyp of transverse colon    Abnormal stress test  02/03/2018   Pain in both upper extremities 02/03/2018   Coronary artery disease 11/22/2012   Hypercholesterolemia 11/22/2012   Tobacco use disorder 11/22/2012    Allergies  Allergen Reactions   Ezetimibe     Other reaction(s): Muscle Pain & joint pain   Atorvastatin Rash    myalgias    Past Surgical History:  Procedure Laterality Date   COLONOSCOPY WITH PROPOFOL N/A 08/23/2019   Procedure: COLONOSCOPY WITH PROPOFOL;  Surgeon: Midge Minium, MD;  Location: [facility];  Service: Endoscopy;  Laterality: N/A;  priority 3   CYSTOSCOPY/URETEROSCOPY/HOLMIUM LASER/STENT PLACEMENT Right 09/14/2020   Procedure: CYSTOSCOPY/URETEROSCOPY/HOLMIUM LASER/STENT PLACEMENT;  Surgeon: Sondra Come, MD;  Location: [facility] ORS;  Service: Urology;  Laterality: Right;   SKIN CANCER EXCISION     mohs nose    Social History   Tobacco Use   Smoking status: Every Day    Current packs/day: 0.00    Average packs/day: 1 pack/day for 50.0 years (50.0 ttl pk-yrs)    Types: Cigarettes    Start date: 02/04/1968    Last attempt to quit: 02/03/2018    Years since quitting: 4.9   Smokeless tobacco: Never   Tobacco comments:    start back on patches  Vaping Use   Vaping status: Never Used  Substance Use Topics   Alcohol use: Yes    Alcohol/week: 1.0 standard drink of alcohol    Types: 1 Cans of beer per week    Comment: occasionally  Drug use: No     Medication list has been reviewed and updated.  Current Meds  Medication Sig   acetaminophen (TYLENOL) 500 MG tablet Take 1,000 mg by mouth every 8 (eight) hours as needed for moderate pain.   aspirin 81 MG chewable tablet Chew 81 mg by mouth daily.   cholecalciferol (VITAMIN D3) 25 MCG (1000 UNIT) tablet Take 1,000 Units by mouth daily.   metoprolol succinate (TOPROL-XL) 25 MG 24 hr tablet Take 25 mg by mouth daily.   nicotine (NICODERM CQ - DOSED IN MG/24 HOURS) 14 mg/24hr patch Place onto the skin.   nitroGLYCERIN (NITROSTAT) 0.4 MG SL tablet  Place 0.4 mg under the tongue every 5 (five) minutes as needed for chest pain.   rosuvastatin (CRESTOR) 5 MG tablet Take 5 mg by mouth every other day.       01/27/2023    4:12 PM 06/12/2022    9:29 AM 08/28/2021    9:50 AM 04/18/2021    9:32 AM  GAD 7 : Generalized Anxiety Score  Nervous, Anxious, on Edge 0 0 0 0  Control/stop worrying 0 0 0 0  Worry too much - different things 0 0 0 0  Trouble relaxing 0 0 0 0  Restless 0 0 0 0  Easily annoyed or irritable 0 0 0 0  Afraid - awful might happen 0 0 0 0  Total GAD 7 Score 0 0 0 0  Anxiety Difficulty Not difficult at all Not difficult at all Not difficult at all Not difficult at all       01/27/2023    4:11 PM 10/29/2022    9:59 AM 06/12/2022    9:29 AM  Depression screen PHQ 2/9  Decreased Interest 0 0 0  Down, Depressed, Hopeless 0 0 0  PHQ - 2 Score 0 0 0  Altered sleeping 0 0 0  Tired, decreased energy 0 0 0  Change in appetite 0 0 0  Feeling bad or failure about yourself  0 0 0  Trouble concentrating 0 0 0  Moving slowly or fidgety/restless 0 0 0  Suicidal thoughts 0 0 0  PHQ-9 Score 0 0 0  Difficult doing work/chores Not difficult at all Not difficult at all Not difficult at all    BP Readings from Last 3 Encounters:  01/27/23 112/74  06/12/22 120/78  06/11/22 (!) 142/98    Physical Exam Vitals and nursing note reviewed.  HENT:     Head: Normocephalic.     Right Ear: Tympanic membrane normal.     Nose:     Right Turbinates: Swollen. Not enlarged.     Left Turbinates: Swollen. Not enlarged.     Right Sinus: Maxillary sinus tenderness present. No frontal sinus tenderness.     Left Sinus: Maxillary sinus tenderness present. No frontal sinus tenderness.     Mouth/Throat:     Mouth: Mucous membranes are moist.     Pharynx: Oropharynx is clear. No oropharyngeal exudate or posterior oropharyngeal erythema.  Cardiovascular:     Heart sounds: S1 normal and S2 normal. No murmur heard.    No S3 or S4 sounds.   Pulmonary:     Breath sounds: No decreased breath sounds, wheezing, rhonchi or rales.  Chest:  Breasts:    Right: Normal.     Left: Normal.  Abdominal:     Palpations: There is no hepatomegaly or splenomegaly.  Musculoskeletal:     Cervical back: Neck supple.  Lymphadenopathy:  Upper Body:     Right upper body: No supraclavicular adenopathy.     Left upper body: No supraclavicular adenopathy.     Wt Readings from Last 3 Encounters:  01/27/23 194 lb (88 kg)  10/29/22 198 lb (89.8 kg)  06/12/22 198 lb (89.8 kg)    BP 112/74   Pulse (!) 56   Ht 5\' 11"  (1.803 m)   Wt 194 lb (88 kg)   SpO2 98%   BMI 27.06 kg/m   Assessment and Plan:  1. Acute maxillary sinusitis, recurrence not specified New onset.  Persistent.  Stable.  History and examination consistent with an acute maxillary sinusitis and will treat with Augmentin 875 mg 1 twice a day as well is encouraged to continue Robitussin DM for mucolytic clearance and mild cough suppression.  Patient is asked to return the symptoms do not improve in the next 24 to 48 hours. - amoxicillin-clavulanate (AUGMENTIN) 875-125 MG tablet; Take 1 tablet by mouth 2 (two) times daily.  Dispense: 20 tablet; Refill: 0    Elizabeth Sauer, MD

## 2023-06-08 ENCOUNTER — Ambulatory Visit: Payer: Medicare Other | Admitting: Dermatology

## 2023-06-08 DIAGNOSIS — L578 Other skin changes due to chronic exposure to nonionizing radiation: Secondary | ICD-10-CM | POA: Diagnosis not present

## 2023-06-08 DIAGNOSIS — Z85828 Personal history of other malignant neoplasm of skin: Secondary | ICD-10-CM

## 2023-06-08 DIAGNOSIS — D692 Other nonthrombocytopenic purpura: Secondary | ICD-10-CM

## 2023-06-08 DIAGNOSIS — W908XXA Exposure to other nonionizing radiation, initial encounter: Secondary | ICD-10-CM

## 2023-06-08 DIAGNOSIS — L814 Other melanin hyperpigmentation: Secondary | ICD-10-CM

## 2023-06-08 DIAGNOSIS — D1801 Hemangioma of skin and subcutaneous tissue: Secondary | ICD-10-CM

## 2023-06-08 DIAGNOSIS — D229 Melanocytic nevi, unspecified: Secondary | ICD-10-CM

## 2023-06-08 DIAGNOSIS — L820 Inflamed seborrheic keratosis: Secondary | ICD-10-CM

## 2023-06-08 DIAGNOSIS — L570 Actinic keratosis: Secondary | ICD-10-CM

## 2023-06-08 DIAGNOSIS — Z1283 Encounter for screening for malignant neoplasm of skin: Secondary | ICD-10-CM | POA: Diagnosis not present

## 2023-06-08 DIAGNOSIS — L821 Other seborrheic keratosis: Secondary | ICD-10-CM

## 2023-06-08 NOTE — Patient Instructions (Addendum)

## 2023-06-08 NOTE — Progress Notes (Signed)
 Follow-Up Visit   Subjective  Darren Page is a 68 y.o. male who presents for the following: Skin Cancer Screening and Full Body Skin Exam Hx of bcc at nose 2018  The patient presents for Total-Body Skin Exam (TBSE) for skin cancer screening and mole check. The patient has spots, moles and lesions to be evaluated, some may be new or changing and the patient may have concern these could be cancer.    The following portions of the chart were reviewed this encounter and updated as appropriate: medications, allergies, medical history  Review of Systems:  No other skin or systemic complaints except as noted in HPI or Assessment and Plan.  Objective  Well appearing patient in no apparent distress; mood and affect are within normal limits.  A full examination was performed including scalp, head, eyes, ears, nose, lips, neck, chest, axillae, abdomen, back, buttocks, bilateral upper extremities, bilateral lower extremities, hands, feet, fingers, toes, fingernails, and toenails. All findings within normal limits unless otherwise noted below.   Relevant physical exam findings are noted in the Assessment and Plan.  face x 10 (10) Erythematous thin papules/macules with gritty scale.  right cheek x 1 Erythematous stuck-on, waxy papule or plaque  Assessment & Plan   SKIN CANCER SCREENING PERFORMED TODAY.  ACTINIC DAMAGE - Chronic condition, secondary to cumulative UV/sun exposure - diffuse scaly erythematous macules with underlying dyspigmentation - Recommend daily broad spectrum sunscreen SPF 30+ to sun-exposed areas, reapply every 2 hours as needed.  - Staying in the shade or wearing long sleeves, sun glasses (UVA+UVB protection) and wide brim hats (4-inch brim around the entire circumference of the hat) are also recommended for sun protection.  - Call for new or changing lesions.  LENTIGINES, SEBORRHEIC KERATOSES, HEMANGIOMAS - Benign normal skin lesions - Benign-appearing -  Call for any changes  MELANOCYTIC NEVI - Tan-brown and/or pink-flesh-colored symmetric macules and papules - Benign appearing on exam today - Observation - Call clinic for new or changing moles - Recommend daily use of broad spectrum spf 30+ sunscreen to sun-exposed areas.   Purpura - Chronic; persistent and recurrent.  Treatable, but not curable. - Violaceous macules and patches - Benign - Related to trauma, age, sun damage and/or use of blood thinners, chronic use of topical and/or oral steroids - Observe - Can use OTC arnica containing moisturizer such as Dermend Bruise Formula if desired - Call for worsening or other concerns    HISTORY OF BASAL CELL CARCINOMA OF THE SKIN Dorsum nose 2018 - No evidence of recurrence today - Recommend regular full body skin exams - Recommend daily broad spectrum sunscreen SPF 30+ to sun-exposed areas, reapply every 2 hours as needed.  - Call if any new or changing lesions are noted between office visits    ACTINIC KERATOSIS (10) face x 10 (10) Actinic keratoses are precancerous spots that appear secondary to cumulative UV radiation exposure/sun exposure over time. They are chronic with expected duration over 1 year. A portion of actinic keratoses will progress to squamous cell carcinoma of the skin. It is not possible to reliably predict which spots will progress to skin cancer and so treatment is recommended to prevent development of skin cancer.  Recommend daily broad spectrum sunscreen SPF 30+ to sun-exposed areas, reapply every 2 hours as needed.  Recommend staying in the shade or wearing long sleeves, sun glasses (UVA+UVB protection) and wide brim hats (4-inch brim around the entire circumference of the hat). Call for new or changing lesions.  Destruction of lesion - face x 10 (10) Complexity: simple   Destruction method: cryotherapy   Informed consent: discussed and consent obtained   Timeout:  patient name, date of birth, surgical  site, and procedure verified Lesion destroyed using liquid nitrogen: Yes   Region frozen until ice ball extended beyond lesion: Yes   Outcome: patient tolerated procedure well with no complications   Post-procedure details: wound care instructions given   INFLAMED SEBORRHEIC KERATOSIS right cheek x 1 Symptomatic, irritating, patient would like treated. Destruction of lesion - right cheek x 1 Complexity: simple   Destruction method: cryotherapy   Informed consent: discussed and consent obtained   Timeout:  patient name, date of birth, surgical site, and procedure verified Lesion destroyed using liquid nitrogen: Yes   Region frozen until ice ball extended beyond lesion: Yes   Outcome: patient tolerated procedure well with no complications   Post-procedure details: wound care instructions given   Return in about 1 year (around 06/07/2024) for TBSE.  IEleanor Blush, CMA, am acting as scribe for Alm Rhyme, MD.   Documentation: I have reviewed the above documentation for accuracy and completeness, and I agree with the above.  Alm Rhyme, MD

## 2023-06-10 ENCOUNTER — Ambulatory Visit: Payer: Medicare Other | Admitting: Dermatology

## 2023-06-10 ENCOUNTER — Encounter: Payer: Self-pay | Admitting: Dermatology

## 2023-11-04 ENCOUNTER — Ambulatory Visit: Payer: Medicare Other | Admitting: Emergency Medicine

## 2023-11-04 VITALS — Ht 71.0 in | Wt 195.0 lb

## 2023-11-04 DIAGNOSIS — Z0000 Encounter for general adult medical examination without abnormal findings: Secondary | ICD-10-CM

## 2023-11-04 DIAGNOSIS — Z1211 Encounter for screening for malignant neoplasm of colon: Secondary | ICD-10-CM

## 2023-11-04 NOTE — Patient Instructions (Signed)
 Mr. Blumer , Thank you for taking time out of your busy schedule to complete your Annual Wellness Visit with me. I enjoyed our conversation and look forward to speaking with you again next year. I, as well as your care team,  appreciate your ongoing commitment to your health goals. Please review the following plan we discussed and let me know if I can assist you in the future. Your Game plan/ To Do List    Referrals: I have placed a referral to [facility] for a colonoscopy. Their ph# is [phone]. If you haven't heard from the office you've been referred to, please reach out to them at the phone provided.   Follow up Visits: Next Medicare AWV with our clinical staff: 11/09/24 @ 9:30am (phone visit)   Have you seen your provider in the last 6 months (3 months if uncontrolled diabetes)? No Next Office Visit with your provider: 11/16/23 @ 2:00pm with Dr. Barnetta Liberty  Clinician Recommendations: Call and schedule a routine eye exam at your earliest convenience.  Aim for 30 minutes of exercise or brisk walking, 6-8 glasses of water, and 5 servings of fruits and vegetables each day.       This is a list of the screening recommended for you and due dates:  Health Maintenance  Topic Date Due   COVID-19 Vaccine (1) Never done   Hepatitis C Screening  Never done   Zoster (Shingles) Vaccine (1 of 2) Never done   Screening for Lung Cancer  Never done   Pneumonia Vaccine (1 of 2 - PCV) 01/27/2024*   Colon Cancer Screening  01/27/2024*   Flu Shot  01/01/2024   Medicare Annual Wellness Visit  11/03/2024   HPV Vaccine  Aged Out   Meningitis B Vaccine  Aged Out   DTaP/Tdap/Td vaccine  Discontinued  *Topic was postponed. The date shown is not the original due date.    Advanced directives: (ACP Link)Information on Advanced Care Planning can be found at [place]  Secretary of [facility] Advance Health Care Directives Advance Health Care Directives. [email or web address] You may also get the forms at your  doctor's office. Advance Care Planning is important because it:  [x]  Makes sure you receive the medical care that is consistent with your values, goals, and preferences  [x]  It provides guidance to your family and loved ones and reduces their decisional burden about whether or not they are making the right decisions based on your wishes.  Follow the link provided in your after visit summary or read over the paperwork we have mailed to you to help you started getting your Advance Directives in place. If you need assistance in completing these, please reach out to us  so that we can help you!  See attachments for Preventive Care and Fall Prevention Tips.   Fall Prevention in the Home, Adult Falls can cause injuries and affect people of all ages. There are many simple things that you can do to make your home safe and to help prevent falls. If you need it, ask for help making these changes. What actions can I take to prevent falls? General information Use good lighting in all rooms. Make sure to: Replace any light bulbs that burn out. Turn on lights if it is dark and use night-lights. Keep items that you use often in easy-to-reach places. Lower the shelves around your home if needed. Move furniture so that there are clear paths around it. Do not keep throw rugs or other things on the floor that  can make you trip. If any of your floors are uneven, fix them. Add color or contrast paint or tape to clearly mark and help you see: Grab bars or handrails. First and last steps of staircases. Where the edge of each step is. If you use a ladder or stepladder: Make sure that it is fully opened. Do not climb a closed ladder. Make sure the sides of the ladder are locked in place. Have someone hold the ladder while you use it. Know where your pets are as you move through your home. What can I do in the bathroom?     Keep the floor dry. Clean up any water that is on the floor right away. Remove soap  buildup in the bathtub or shower. Buildup makes bathtubs and showers slippery. Use non-skid mats or decals on the floor of the bathtub or shower. Attach bath mats securely with double-sided, non-slip rug tape. If you need to sit down while you are in the shower, use a non-slip stool. Install grab bars by the toilet and in the bathtub and shower. Do not use towel bars as grab bars. What can I do in the bedroom? Make sure that you have a light by your bed that is easy to reach. Do not use any sheets or blankets on your bed that hang to the floor. Have a firm bench or chair with side arms that you can use for support when you get dressed. What can I do in the kitchen? Clean up any spills right away. If you need to reach something above you, use a sturdy step stool that has a grab bar. Keep electrical cables out of the way. Do not use floor polish or wax that makes floors slippery. What can I do with my stairs? Do not leave anything on the stairs. Make sure that you have a light switch at the top and the bottom of the stairs. Have them installed if you do not have them. Make sure that there are handrails on both sides of the stairs. Fix handrails that are broken or loose. Make sure that handrails are as long as the staircases. Install non-slip stair treads on all stairs in your home if they do not have carpet. Avoid having throw rugs at the top or bottom of stairs, or secure the rugs with carpet tape to prevent them from moving. Choose a carpet design that does not hide the edge of steps on the stairs. Make sure that carpet is firmly attached to the stairs. Fix any carpet that is loose or worn. What can I do on the outside of my home? Use bright outdoor lighting. Repair the edges of walkways and driveways and fix any cracks. Clear paths of anything that can make you trip, such as tools or rocks. Add color or contrast paint or tape to clearly mark and help you see high doorway thresholds. Trim  any bushes or trees on the main path into your home. Check that handrails are securely fastened and in good repair. Both sides of all steps should have handrails. Install guardrails along the edges of any raised decks or porches. Have leaves, snow, and ice cleared regularly. Use sand, salt, or ice melt on walkways during winter months if you live where there is ice and snow. In the garage, clean up any spills right away, including grease or oil spills. What other actions can I take? Review your medicines with your health care provider. Some medicines can make you confused or  feel dizzy. This can increase your chance of falling. Wear closed-toe shoes that fit well and support your feet. Wear shoes that have rubber soles and low heels. Use a cane, walker, scooter, or crutches that help you move around if needed. Talk with your provider about other ways that you can decrease your risk of falls. This may include seeing a physical therapist to learn to do exercises to improve movement and strength. Where to find more information Centers for Disease Control and Prevention, STEADI: TonerPromos.no [employer] on Aging: BaseRingTones.pl National Institute on Aging: BaseRingTones.pl Contact a health care provider if: You are afraid of falling at home. You feel weak, drowsy, or dizzy at home. You fall at home. Get help right away if you: Lose consciousness or have trouble moving after a fall. Have a fall that causes a head injury. These symptoms may be an emergency. Get help right away. Call 911. Do not wait to see if the symptoms will go away. Do not drive yourself to the hospital. This information is not intended to replace advice given to you by your health care provider. Make sure you discuss any questions you have with your health care provider. Document Revised: 01/20/2022 Document Reviewed: 01/20/2022 Elsevier Patient Education  2024 [employer].

## 2023-11-04 NOTE — Progress Notes (Signed)
 Subjective:   Darren Page is a 68 y.o. who presents for a Medicare Wellness preventive visit.  As a reminder, Annual Wellness Visits don't include a physical exam, and some assessments may be limited, especially if this visit is performed virtually. We may recommend an in-person follow-up visit with your provider if needed.  Visit Complete: Virtual I connected with  Darren Page on 11/04/23 by a audio enabled telemedicine application and verified that I am speaking with the correct person using two identifiers.  Patient Location: Home  Provider Location: Home Office  I discussed the limitations of evaluation and management by telemedicine. The patient expressed understanding and agreed to proceed.  Vital Signs: Because this visit was a virtual/telehealth visit, some criteria may be missing or patient reported. Any vitals not documented were not able to be obtained and vitals that have been documented are patient reported.  VideoDeclined- This patient declined Librarian, academic. Therefore the visit was completed with audio only.  Persons Participating in Visit: Patient.  AWV Questionnaire: No: Patient Medicare AWV questionnaire was not completed prior to this visit.  Cardiac Risk Factors include: advanced age (>40men, >66 women);male gender;dyslipidemia;smoking/ tobacco exposure;Other (see comment), Risk factor comments: CAD     Objective:     Today's Vitals   11/04/23 1011  Weight: 195 lb (88.5 kg)  Height: 5\' 11"  (1.803 m)   Body mass index is 27.2 kg/m.     11/04/2023   10:29 AM 10/29/2022   10:00 AM 10/21/2021    1:08 PM 09/13/2020   11:28 AM 09/05/2020    2:38 PM 07/31/2018    9:25 AM  Advanced Directives  Does Patient Have a Medical Advance Directive? No No No No No No  Would patient like information on creating a medical advance directive? Yes (MAU/Ambulatory/Procedural Areas - Information given) No - Patient declined  Yes (MAU/Ambulatory/Procedural Areas - Information given)       Current Medications (verified) Outpatient Encounter Medications as of 11/04/2023  Medication Sig   acetaminophen  (TYLENOL ) 500 MG tablet Take 1,000 mg by mouth every 8 (eight) hours as needed for moderate pain.   aspirin 81 MG chewable tablet Chew 81 mg by mouth daily.   cholecalciferol (VITAMIN D3) 25 MCG (1000 UNIT) tablet Take 1,000 Units by mouth daily.   Coenzyme Q10 (CO Q 10 PO) Take by mouth daily. 300mg  daily   metoprolol succinate (TOPROL-XL) 25 MG 24 hr tablet Take 25 mg by mouth daily.   nicotine  (NICODERM CQ  - DOSED IN MG/24 HOURS) 14 mg/24hr patch Place onto the skin.   nitroGLYCERIN (NITROSTAT) 0.4 MG SL tablet Place 0.4 mg under the tongue every 5 (five) minutes as needed for chest pain.   Omega-3 1000 MG CAPS Take 1,000 mg by mouth daily.   rosuvastatin (CRESTOR) 5 MG tablet Take 5 mg by mouth every other day.   amoxicillin -clavulanate (AUGMENTIN ) 875-125 MG tablet Take 1 tablet by mouth 2 (two) times daily. (Patient not taking: Reported on 11/04/2023)   No facility-administered encounter medications on file as of 11/04/2023.    Allergies (verified) Ezetimibe and Atorvastatin   History: Past Medical History:  Diagnosis Date   Aneurysm of aorta (HCC)    Basal cell carcinoma 2018   dorsum nose, Excised by Dr. Tyrone Gallop   COPD (chronic obstructive pulmonary disease) [facility])    Coronary artery disease    History of acute anterior wall MI 12/2003   History of kidney stones    History of sebaceous  cyst 07/19/2017   Left shoulder   Hyperlipidemia    Hypertension    Past Surgical History:  Procedure Laterality Date   COLONOSCOPY WITH PROPOFOL  N/A 08/23/2019   Procedure: COLONOSCOPY WITH PROPOFOL ;  Surgeon: Marnee Sink, MD;  Location: [facility] ENDOSCOPY;  Service: Endoscopy;  Laterality: N/A;  priority 3   CYSTOSCOPY/URETEROSCOPY/HOLMIUM LASER/STENT PLACEMENT Right 09/14/2020   Procedure: CYSTOSCOPY/URETEROSCOPY/HOLMIUM  LASER/STENT PLACEMENT;  Surgeon: Lawerence Pressman, MD;  Location: [facility] ORS;  Service: Urology;  Laterality: Right;   SKIN CANCER EXCISION     mohs nose   Family History  Problem Relation Age of Onset   Cancer Mother    Kidney failure Mother    Prostate cancer Father    Social History   Socioeconomic History   Marital status: Married    Spouse name: Felipa Horsfall   Number of children: 3   Years of education: Not on file   Highest education level: Not on file  Occupational History   Occupation: retired  Tobacco Use   Smoking status: Some Days    Current packs/day: 0.10    Average packs/day: 1 pack/day for 50.8 years (50.1 ttl pk-yrs)    Types: Cigarettes    Start date: 02/04/1968    Last attempt to quit: 02/03/2018    Passive exposure: Past   Smokeless tobacco: Never   Tobacco comments:    11/04/23 1-2 cigarettes some days, using Nicotine  patches  Vaping Use   Vaping status: Never Used  Substance and Sexual Activity   Alcohol use: Yes    Alcohol/week: 1.0 - 2.0 standard drink of alcohol    Types: 1 - 2 Cans of beer per week    Comment: 1-2 beers per week   Drug use: No   Sexual activity: Not Currently  Other Topics Concern   Not on file  Social History Narrative   Not on file   Social Drivers of Health   Financial Resource Strain: Low Risk  (11/04/2023)   Overall Financial Resource Strain (CARDIA)    Difficulty of Paying Living Expenses: Not hard at all  Food Insecurity: No Food Insecurity (11/04/2023)   Hunger Vital Sign    Worried About Running Out of Food in the Last Year: Never true    Ran Out of Food in the Last Year: Never true  Transportation Needs: No Transportation Needs (11/04/2023)   PRAPARE - Administrator, Civil Service (Medical): No    Lack of Transportation (Non-Medical): No  Physical Activity: Sufficiently Active (11/04/2023)   Exercise Vital Sign    Days of Exercise per Week: 4 days    Minutes of Exercise per Session: 60 min  Stress: No Stress  Concern Present (11/04/2023)   [employer] of Occupational Health - Occupational Stress Questionnaire    Feeling of Stress : Only a little  Social Connections: Moderately Integrated (11/04/2023)   Social Connection and Isolation Panel [NHANES]    Frequency of Communication with Friends and Family: More than three times a week    Frequency of Social Gatherings with Friends and Family: More than three times a week    Attends Religious Services: More than 4 times per year    Active Member of [employer] or Organizations: No    Attends Banker Meetings: Never    Marital Status: Married    Tobacco Counseling Ready to quit: Not Answered Counseling given: Not Answered Tobacco comments: 11/04/23 1-2 cigarettes some days, using Nicotine  patches    Clinical Intake:  Pre-visit preparation  completed: Yes  Pain : No/denies pain     BMI - recorded: 27.2 Nutritional Status: BMI 25 -29 Overweight Nutritional Risks: None Diabetes: No  No results found for: "HGBA1C"   How often do you need to have someone help you when you read instructions, pamphlets, or other written materials from your doctor or pharmacy?: 1 - Never  Interpreter Needed?: No  Information entered by :: Jaunita Messier, CMA   Activities of Daily Living     11/04/2023   10:13 AM  In your present state of health, do you have any difficulty performing the following activities:  Hearing? 1  Comment wears hearing aids  Vision? 0  Difficulty concentrating or making decisions? 0  Walking or climbing stairs? 0  Dressing or bathing? 0  Doing errands, shopping? 0  Preparing Food and eating ? N  Using the Toilet? N  In the past six months, have you accidently leaked urine? N  Do you have problems with loss of bowel control? N  Managing your Medications? N  Managing your Finances? N  Housekeeping or managing your Housekeeping? N    Patient Care Team: Barnetta Liberty, MD as PCP - General (Internal  Medicine) Eugenia Hess Orysia Blas, MD as Referring Physician (Cardiology) Elta Halter, MD (Dermatology)  I have updated your Care Teams any recent Medical Services you may have received from other providers in the past year.     Assessment:    This is a routine wellness examination for Deagan.  Hearing/Vision screen Hearing Screening - Comments:: Wears hearing aids Vision Screening - Comments:: Needs routine eye exam, Will call Dr. Wayna Hails in [place] to schedule and exams, declined referral   Goals Addressed             This Visit's Progress    Patient Stated       Get a colonoscopy this year       Depression Screen     11/04/2023   10:27 AM 01/27/2023    4:11 PM 10/29/2022    9:59 AM 06/12/2022    9:29 AM 10/21/2021    1:07 PM 08/28/2021    9:50 AM 04/18/2021    9:31 AM  PHQ 2/9 Scores  PHQ - 2 Score 0 0 0 0 0 0 0  PHQ- 9 Score 0 0 0 0  0 0    Fall Risk     11/04/2023   10:31 AM 01/27/2023    4:11 PM 10/29/2022   10:01 AM 10/28/2022   10:38 AM 06/12/2022    9:28 AM  Fall Risk   Falls in the past year? 0 0 0 0 0  Number falls in past yr: 0 0 0 0 0  Injury with Fall? 0 0 0 0 0  Risk for fall due to : No Fall Risks No Fall Risks No Fall Risks  No Fall Risks  Follow up Falls evaluation completed Falls evaluation completed Falls prevention discussed;Falls evaluation completed  Falls evaluation completed    MEDICARE RISK AT HOME:  Medicare Risk at Home Any stairs in or around the home?: Yes If so, are there any without handrails?: No Home free of loose throw rugs in walkways, pet beds, electrical cords, etc?: Yes Adequate lighting in your home to reduce risk of falls?: Yes Life alert?: No Use of a cane, walker or w/c?: No Grab bars in the bathroom?: No Shower chair or bench in shower?: No Elevated toilet seat or a handicapped toilet?: No  TIMED UP AND  GO:  Was the test performed?  No  Cognitive Function: 6CIT completed        11/04/2023   10:33 AM  10/29/2022   10:05 AM  6CIT Screen  What Year? 0 points 0 points  What month? 0 points 0 points  What time? 0 points 0 points  Count back from 20 0 points 0 points  Months in reverse 0 points 0 points  Repeat phrase 0 points 0 points  Total Score 0 points 0 points    Immunizations  There is no immunization history on file for this patient.  Screening Tests Health Maintenance  Topic Date Due   COVID-19 Vaccine (1) Never done   Hepatitis C Screening  Never done   Zoster Vaccines- Shingrix (1 of 2) Never done   Lung Cancer Screening  Never done   Pneumonia Vaccine 6+ Years old (1 of 2 - PCV) 01/27/2024 (Originally 04/01/1975)   Colonoscopy  01/27/2024 (Originally 08/22/2020)   INFLUENZA VACCINE  01/01/2024   Medicare Annual Wellness (AWV)  11/03/2024   HPV VACCINES  Aged Out   Meningococcal B Vaccine  Aged Out   DTaP/Tdap/Td  Discontinued    Health Maintenance  Health Maintenance Due  Topic Date Due   COVID-19 Vaccine (1) Never done   Hepatitis C Screening  Never done   Zoster Vaccines- Shingrix (1 of 2) Never done   Lung Cancer Screening  Never done   Health Maintenance Items Addressed: Referral sent to GI for colonoscopy, See Nurse Notes at the end of this note  Additional Screening:  Vision Screening: Recommended annual ophthalmology exams for early detection of glaucoma and other disorders of the eye. Would you like a referral to an eye doctor? No    Dental Screening: Recommended annual dental exams for proper oral hygiene  Community Resource Referral / Chronic Care Management: CRR required this visit?  No   CCM required this visit?  No   Plan:    I have personally reviewed and noted the following in the patient's chart:   Medical and social history Use of alcohol, tobacco or illicit drugs  Current medications and supplements including opioid prescriptions. Patient is not currently taking opioid prescriptions. Functional ability and status Nutritional  status Physical activity Advanced directives List of other physicians Hospitalizations, surgeries, and ER visits in previous 12 months Vitals Screenings to include cognitive, depression, and falls Referrals and appointments  In addition, I have reviewed and discussed with patient certain preventive protocols, quality metrics, and best practice recommendations. A written personalized care plan for preventive services as well as general preventive health recommendations were provided to patient.   Jaunita Messier, CMA   11/04/2023   After Visit Summary: (Mail) Due to this being a telephonic visit, the after visit summary with patients personalized plan was offered to patient via mail   Notes: Please refer to Routing Comments.

## 2023-11-16 ENCOUNTER — Ambulatory Visit (INDEPENDENT_AMBULATORY_CARE_PROVIDER_SITE_OTHER): Admitting: Student

## 2023-11-16 ENCOUNTER — Encounter: Payer: Self-pay | Admitting: Student

## 2023-11-16 VITALS — BP 126/82 | HR 62 | Ht 71.0 in | Wt 201.4 lb

## 2023-11-16 DIAGNOSIS — I2510 Atherosclerotic heart disease of native coronary artery without angina pectoris: Secondary | ICD-10-CM

## 2023-11-16 DIAGNOSIS — D123 Benign neoplasm of transverse colon: Secondary | ICD-10-CM

## 2023-11-16 DIAGNOSIS — Z0000 Encounter for general adult medical examination without abnormal findings: Secondary | ICD-10-CM | POA: Insufficient documentation

## 2023-11-16 DIAGNOSIS — F17200 Nicotine dependence, unspecified, uncomplicated: Secondary | ICD-10-CM | POA: Diagnosis not present

## 2023-11-16 MED ORDER — NICOTINE 7 MG/24HR TD PT24: 7.0000 mg | MEDICATED_PATCH | Freq: Every day | TRANSDERMAL | 2 refills | Status: AC

## 2023-11-16 NOTE — Assessment & Plan Note (Addendum)
 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 No anginal symptoms. LDL in August <70. Will repeat at next visit if not done by cardiology.

## 2023-11-16 NOTE — Assessment & Plan Note (Signed)
 Overdue to colonoscopy, he will call to schedule this.

## 2023-11-16 NOTE — Progress Notes (Signed)
 Established Patient Office Visit  Subjective   Patient ID: Darren Page, male    DOB: 24-Dec-1955  Age: 68 y.o. MRN: [id number]  Chief Complaint  Patient presents with   Transitions Of Care    Darren Page is a 68 year old person living with CAD, HLD, tobacco use disorder, history of basal cell carcinoma of the nose in 2018 s/p excision presents today for transfer of care. Previously seen by Dr. Rochelle Page who is retired.    Patient Active Problem List   Diagnosis Date Noted   Healthcare maintenance 11/16/2023   Polyp of transverse colon    Abnormal stress test 02/03/2018   Coronary artery disease 11/22/2012   Tobacco use disorder 11/22/2012      ROS Refer to HPI    Objective:     BP 126/82   Pulse 62   Ht 5' 11 (1.803 m)   Wt 201 lb 6 oz (91.3 kg)   SpO2 97%   BMI 28.09 kg/m  BP Readings from Last 3 Encounters:  11/16/23 126/82  01/27/23 112/74  06/12/22 120/78    Physical Exam Constitutional:      Appearance: Normal appearance.  HENT:     Mouth/Throat:     Mouth: Mucous membranes are moist.     Pharynx: Oropharynx is clear.  Neck:     Comments: No thyromegaly Cardiovascular:     Rate and Rhythm: Normal rate and regular rhythm.  Pulmonary:     Effort: Pulmonary effort is normal.     Breath sounds: No rhonchi or rales.  Abdominal:     General: Abdomen is flat. Bowel sounds are normal. There is no distension.     Palpations: Abdomen is soft.     Tenderness: There is no abdominal tenderness.   Musculoskeletal:        General: Normal range of motion.     Cervical back: No tenderness.     Right lower leg: No edema.     Left lower leg: No edema.   Skin:    General: Skin is warm and dry.     Capillary Refill: Capillary refill takes less than 2 seconds.     Comments: Sun related changes, healed excision side on dorsum of nose   Neurological:     General: No focal deficit present.     Mental Status: He is alert and oriented to person,  place, and time.   Psychiatric:        Mood and Affect: Mood normal.        Behavior: Behavior normal.        11/16/2023    1:57 PM 11/04/2023   10:27 AM 01/27/2023    4:11 PM  Depression screen PHQ 2/9  Decreased Interest 0 0 0  Down, Depressed, Hopeless 0 0 0  PHQ - 2 Score 0 0 0  Altered sleeping 0 0 0  Tired, decreased energy 0 0 0  Change in appetite 0 0 0  Feeling bad or failure about yourself  0 0 0  Trouble concentrating 0 0 0  Moving slowly or fidgety/restless 0 0 0  Suicidal thoughts 0 0 0  PHQ-9 Score 0 0 0  Difficult doing work/chores Not difficult at all Not difficult at all Not difficult at all       11/16/2023    1:58 PM 01/27/2023    4:12 PM 06/12/2022    9:29 AM 08/28/2021    9:50 AM  GAD 7 : Generalized Anxiety Score  Nervous, Anxious, on Edge 0 0 0 0  Control/stop worrying 0 0 0 0  Worry too much - different things 0 0 0 0  Trouble relaxing 0 0 0 0  Restless 0 0 0 0  Easily annoyed or irritable 0 0 0 0  Afraid - awful might happen 0 0 0 0  Total GAD 7 Score 0 0 0 0  Anxiety Difficulty Not difficult at all Not difficult at all Not difficult at all Not difficult at all    No results found for any visits on 11/16/23.  Last CBC Lab Results  Component Value Date   WBC 9.1 09/05/2020   HGB 17.9 (H) 09/05/2020   HCT 52.8 (H) 09/05/2020   MCV 94.8 09/05/2020   MCH 32.1 09/05/2020   RDW 12.9 09/05/2020   PLT 239 09/05/2020   Last metabolic panel Lab Results  Component Value Date   GLUCOSE 125 (H) 09/05/2020   NA 137 09/05/2020   K 4.5 09/05/2020   CL 104 09/05/2020   CO2 26 09/05/2020   BUN 29 (H) 09/05/2020   CREATININE 1.32 (H) 09/05/2020   GFRNONAA 59 (L) 09/05/2020   CALCIUM 9.2 09/05/2020   PROT 8.2 (H) 09/05/2020   ALBUMIN 4.7 09/05/2020   BILITOT 0.5 09/05/2020   ALKPHOS 68 09/05/2020   AST 29 09/05/2020   ALT 23 09/05/2020   ANIONGAP 7 09/05/2020      The ASCVD Risk score (Arnett DK, et al., 2019) failed to calculate for the  following reasons:   The valid total cholesterol range is 130 to 320 mg/dL    Assessment & Plan:  Tobacco use disorder Assessment & Plan: Is using nicotine  7 mg every few days. Down to smoking 1 cigarette a day. Would like to stop by September.  Encouraged him to continue to work on smoking cessation.   Shared Decision Making: Current smoker with approximately a 50 pack year history. The patient meets USPTF recommendations for lung cancer screening with low dose CT in persons age 70 - 70 yrs with a 30 pack year history who are currently smoking or have quit in the past 15 yrs. We have discussed the risks and benefits of screening, including but not limited to the following. Lung cancer screening might detect about one half of lung cancer at an early, curative stage. For the average screened person, the risk of dying from lung cancer will decrease from 1.7% to 1.4% with screening. About 1 in 4 screened patients will have a false positive result possibly leading to more radiation exposure, cost, anxiety, and / or invasive procedures. 95% of false positives will not lead to a diagnosis of cancer. After discussion, Darren Page has not decided to pursue lung cancer screening.    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 No anginal symptoms. LDL in August <70. Will repeat at next visit if not done by cardiology.    Adenomatous polyp of transverse colon Assessment & Plan: Overdue to colonoscopy, he will call to schedule this.    Healthcare maintenance Assessment & Plan: Declined shingles vaccine, reports relative vaccine and side effects where bothersome. Discussed the risks and benefits of the vaccine including possible postherpetic neuralgia and vision loss. He understands and declines vaccination.    Other orders -     Nicotine ; Place 1 patch (7 mg total)  onto the skin daily.  Dispense: 30 patch;  Refill: 2     Return in about 6 months (around 05/17/2024) for chronic f/u.    Barnetta Liberty, MD

## 2023-11-16 NOTE — Assessment & Plan Note (Addendum)
 Is using nicotine  7 mg every few days. Down to smoking 1 cigarette a day. Would like to stop by September.  Encouraged him to continue to work on smoking cessation.   Shared Decision Making: Current smoker with approximately a 50 pack year history. The patient meets USPTF recommendations for lung cancer screening with low dose CT in persons age 70 - 35 yrs with a 30 pack year history who are currently smoking or have quit in the past 15 yrs. We have discussed the risks and benefits of screening, including but not limited to the following. Lung cancer screening might detect about one half of lung cancer at an early, curative stage. For the average screened person, the risk of dying from lung cancer will decrease from 1.7% to 1.4% with screening. About 1 in 4 screened patients will have a false positive result possibly leading to more radiation exposure, cost, anxiety, and / or invasive procedures. 95% of false positives will not lead to a diagnosis of cancer. After discussion, [name] has not decided to pursue lung cancer screening.

## 2023-11-16 NOTE — Assessment & Plan Note (Signed)
 Declined shingles vaccine, reports relative vaccine and side effects where bothersome. Discussed the risks and benefits of the vaccine including possible postherpetic neuralgia and vision loss. He understands and declines vaccination.

## 2023-11-25 ENCOUNTER — Encounter: Payer: Self-pay | Admitting: *Deleted

## 2023-12-14 ENCOUNTER — Telehealth: Payer: Self-pay

## 2023-12-14 ENCOUNTER — Other Ambulatory Visit: Payer: Self-pay

## 2023-12-14 DIAGNOSIS — Z860100 Personal history of colon polyps, unspecified: Secondary | ICD-10-CM

## 2023-12-14 MED ORDER — NA SULFATE-K SULFATE-MG SULF 17.5-3.13-1.6 GM/177ML PO SOLN: 1.0000 | Freq: Once | ORAL | 0 refills | Status: AC

## 2023-12-14 NOTE — Telephone Encounter (Signed)
 Repeat colonoscopy past due, wife called to schedule

## 2023-12-14 NOTE — Telephone Encounter (Signed)
 Gastroenterology Pre-Procedure Review  Request Date: March 15, 2024 Requesting Physician: Dr. Jinny  PATIENT REVIEW QUESTIONS: The patient responded to the following health history questions as indicated:    1. Are you having any GI issues? no 2. Do you have a personal history of Polyps? yes 3. Do you have a family history of Colon Cancer or Polyps? yes (mother) 4. Diabetes Mellitus? no 5. Joint replacements in the past 12 months?no 6. Major health problems in the past 3 months?no 7. Any artificial heart valves, MVP, or defibrillator?no    MEDICATIONS & ALLERGIES:    Patient reports the following regarding taking any anticoagulation/antiplatelet therapy:   Plavix, Coumadin, Eliquis, Xarelto, Lovenox, Pradaxa, Brilinta, or Effient? no Aspirin? yes (81mg )  Patient confirms/reports the following medications:  Current Outpatient Medications  Medication Sig Dispense Refill   Glucosamine-Chondroitin (OSTEO BI-FLEX REGULAR STRENGTH PO) Take by mouth.     acetaminophen  (TYLENOL ) 500 MG tablet Take 1,000 mg by mouth every 8 (eight) hours as needed for moderate pain.     aspirin 81 MG chewable tablet Chew 81 mg by mouth daily.     cholecalciferol (VITAMIN D3) 25 MCG (1000 UNIT) tablet Take 1,000 Units by mouth daily.     Coenzyme Q10 (CO Q 10 PO) Take by mouth daily. 300mg  daily     metoprolol succinate (TOPROL-XL) 25 MG 24 hr tablet Take 25 mg by mouth daily.     nicotine  (NICODERM CQ  - DOSED IN MG/24 HR) 7 mg/24hr patch Place 1 patch (7 mg total) onto the skin daily. 30 patch 2   nitroGLYCERIN (NITROSTAT) 0.4 MG SL tablet Place 0.4 mg under the tongue every 5 (five) minutes as needed for chest pain.     rosuvastatin (CRESTOR) 5 MG tablet Take 5 mg by mouth every other day.     No current facility-administered medications for this visit.    Patient confirms/reports the following allergies:  Allergies  Allergen Reactions   Ezetimibe     Other reaction(s): Muscle Pain & joint pain    Atorvastatin Rash    myalgias    No orders of the defined types were placed in this encounter.   AUTHORIZATION INFORMATION Primary Insurance:Medicare 1D#: Group #:  Secondary Insurance: [employer] 1D#: Group #:  SCHEDULE INFORMATION: Date:  Time: Location:

## 2024-02-02 ENCOUNTER — Telehealth: Payer: Self-pay

## 2024-02-02 NOTE — Telephone Encounter (Signed)
 Spouse Heron called to reschedule upcoming colonoscopy for pt for the 2nd time... Moved to 9/30... She sates she has the original PPW and will just change the datem no need to send new PPW as I just mailed last week

## 2024-02-03 NOTE — Telephone Encounter (Signed)
 Endo notified of the change from 9/25 to 9/30

## 2024-03-01 ENCOUNTER — Encounter: Payer: Self-pay | Admitting: Gastroenterology

## 2024-03-01 ENCOUNTER — Ambulatory Visit
Admission: RE | Admit: 2024-03-01 | Discharge: 2024-03-01 | Disposition: A | Discharge status: Home / Self Care | Attending: Gastroenterology | Admitting: Gastroenterology

## 2024-03-01 ENCOUNTER — Encounter: Admission: RE | Disposition: A | Payer: Self-pay | Source: Home / Self Care | Attending: Gastroenterology

## 2024-03-01 ENCOUNTER — Other Ambulatory Visit: Payer: Self-pay

## 2024-03-01 ENCOUNTER — Ambulatory Visit

## 2024-03-01 DIAGNOSIS — I2510 Atherosclerotic heart disease of native coronary artery without angina pectoris: Secondary | ICD-10-CM | POA: Diagnosis not present

## 2024-03-01 DIAGNOSIS — Z1211 Encounter for screening for malignant neoplasm of colon: Secondary | ICD-10-CM | POA: Diagnosis present

## 2024-03-01 DIAGNOSIS — D123 Benign neoplasm of transverse colon: Secondary | ICD-10-CM | POA: Diagnosis not present

## 2024-03-01 DIAGNOSIS — F17210 Nicotine dependence, cigarettes, uncomplicated: Secondary | ICD-10-CM | POA: Diagnosis not present

## 2024-03-01 DIAGNOSIS — K5730 Diverticulosis of large intestine without perforation or abscess without bleeding: Secondary | ICD-10-CM | POA: Insufficient documentation

## 2024-03-01 DIAGNOSIS — K641 Second degree hemorrhoids: Secondary | ICD-10-CM | POA: Diagnosis not present

## 2024-03-01 DIAGNOSIS — I10 Essential (primary) hypertension: Secondary | ICD-10-CM | POA: Insufficient documentation

## 2024-03-01 DIAGNOSIS — K635 Polyp of colon: Secondary | ICD-10-CM | POA: Diagnosis not present

## 2024-03-01 DIAGNOSIS — K621 Rectal polyp: Secondary | ICD-10-CM | POA: Diagnosis not present

## 2024-03-01 DIAGNOSIS — Z860101 Personal history of adenomatous and serrated colon polyps: Secondary | ICD-10-CM

## 2024-03-01 DIAGNOSIS — D124 Benign neoplasm of descending colon: Secondary | ICD-10-CM | POA: Insufficient documentation

## 2024-03-01 DIAGNOSIS — Z860100 Personal history of colon polyps, unspecified: Secondary | ICD-10-CM

## 2024-03-01 DIAGNOSIS — J449 Chronic obstructive pulmonary disease, unspecified: Secondary | ICD-10-CM | POA: Insufficient documentation

## 2024-03-01 HISTORY — PX: POLYPECTOMY: SHX149

## 2024-03-01 HISTORY — PX: COLONOSCOPY: SHX5424

## 2024-03-01 SURGERY — COLONOSCOPY
Anesthesia: General

## 2024-03-01 MED ORDER — SODIUM CHLORIDE 0.9 % IV SOLN: INTRAVENOUS | Status: DC

## 2024-03-01 MED ORDER — EPHEDRINE SULFATE-NACL 50-0.9 MG/10ML-% IV SOSY
PREFILLED_SYRINGE | INTRAVENOUS | Status: DC | PRN
  Administered 2024-03-01: 10 mg via INTRAVENOUS

## 2024-03-01 MED ORDER — EPHEDRINE 5 MG/ML INJ
INTRAVENOUS | Status: AC
  Filled 2024-03-01: qty 5

## 2024-03-01 MED ORDER — PROPOFOL 500 MG/50ML IV EMUL
INTRAVENOUS | Status: DC | PRN
  Administered 2024-03-01: 150 ug/kg/min via INTRAVENOUS

## 2024-03-01 MED ORDER — PROPOFOL 10 MG/ML IV BOLUS
INTRAVENOUS | Status: DC | PRN
  Administered 2024-03-01: 100 mg via INTRAVENOUS

## 2024-03-01 MED ORDER — LIDOCAINE HCL (CARDIAC) PF 100 MG/5ML IV SOSY
PREFILLED_SYRINGE | INTRAVENOUS | Status: DC | PRN
  Administered 2024-03-01: 100 mg via INTRAVENOUS

## 2024-03-01 MED ORDER — LIDOCAINE HCL (PF) 2 % IJ SOLN
INTRAMUSCULAR | Status: AC
  Filled 2024-03-01: qty 5

## 2024-03-01 NOTE — Anesthesia Preprocedure Evaluation (Signed)
 Anesthesia Evaluation  Patient identified by MRN, date of birth, ID band Patient awake    Reviewed: Allergy & Precautions, H&P , NPO status , Patient's Chart, lab work & pertinent test results, reviewed documented beta blocker date and time   Airway Mallampati: II   Neck ROM: full    Dental  (+) Poor Dentition   Pulmonary COPD, Current Smoker and Patient abstained from smoking.   Pulmonary exam normal        Cardiovascular Exercise Tolerance: Good hypertension, + CAD  Normal cardiovascular exam Rhythm:regular Rate:Normal     Neuro/Psych negative neurological ROS  negative psych ROS   GI/Hepatic negative GI ROS, Neg liver ROS,,,  Endo/Other  negative endocrine ROS    Renal/GU negative Renal ROS  negative genitourinary   Musculoskeletal   Abdominal   Peds  Hematology negative hematology ROS (+)   Anesthesia Other Findings Past Medical History: No date: Aneurysm of aorta 2018: Basal cell carcinoma     Comment:  dorsum nose, Excised by Dr. Arlyss No date: COPD (chronic obstructive pulmonary disease) (HCC) No date: Coronary artery disease 12/2003: History of acute anterior wall MI No date: History of kidney stones 07/19/2017: History of sebaceous cyst     Comment:  Left shoulder No date: Hyperlipidemia No date: Hypertension Past Surgical History: 08/23/2019: COLONOSCOPY WITH PROPOFOL ; N/A     Comment:  Procedure: COLONOSCOPY WITH PROPOFOL ;  Surgeon: Jinny Carmine, MD;  Location: [facility] ENDOSCOPY;  Service:               Endoscopy;  Laterality: N/A;  priority 3 09/14/2020: CYSTOSCOPY/URETEROSCOPY/HOLMIUM LASER/STENT PLACEMENT;  Right     Comment:  Procedure: CYSTOSCOPY/URETEROSCOPY/HOLMIUM LASER/STENT               PLACEMENT;  Surgeon: Francisca Redell BROCKS, MD;  Location:               [facility] ORS;  Service: Urology;  Laterality: Right; No date: SKIN CANCER EXCISION     Comment:  mohs nose BMI    Body  Mass Index: 27.21 kg/m     Reproductive/Obstetrics negative OB ROS                              Anesthesia Physical Anesthesia Plan  ASA: 3  Anesthesia Plan: General   Post-op Pain Management:    Induction:   PONV Risk Score and Plan:   Airway Management Planned:   Additional Equipment:   Intra-op Plan:   Post-operative Plan:   Informed Consent: I have reviewed the patients History and Physical, chart, labs and discussed the procedure including the risks, benefits and alternatives for the proposed anesthesia with the patient or authorized representative who has indicated his/her understanding and acceptance.     Dental Advisory Given  Plan Discussed with: CRNA  Anesthesia Plan Comments:         Anesthesia Quick Evaluation

## 2024-03-01 NOTE — Anesthesia Postprocedure Evaluation (Signed)
 Anesthesia Post Note  Patient: Darren Page  Procedure(s) Performed: COLONOSCOPY POLYPECTOMY, INTESTINE  Patient location during evaluation: PACU Anesthesia Type: General Level of consciousness: awake and alert Pain management: pain level controlled Vital Signs Assessment: post-procedure vital signs reviewed and stable Respiratory status: spontaneous breathing, nonlabored ventilation, respiratory function stable and patient connected to nasal cannula oxygen Cardiovascular status: blood pressure returned to baseline and stable Postop Assessment: no apparent nausea or vomiting Anesthetic complications: no   No notable events documented.   Last Vitals:  Vitals:   03/01/24 1119 03/01/24 1129  BP: 111/69 114/74  Pulse: (!) 54 (!) 55  Resp: 14 12  Temp: (!) 36.3 C (!) 36.3 C  SpO2: 99% 99%    Last Pain:  Vitals:   03/01/24 1129  TempSrc: Temporal  PainSc: 0-No pain                 Lynwood KANDICE Clause

## 2024-03-01 NOTE — Op Note (Signed)
 [facility] Patient Name: Darren Page Procedure Date: 03/01/2024 10:42 AM MRN: [id number] Account #: [id number] Date of Birth: 08-15-1955 Admit Type: Outpatient Age: 68 Room: [facility] ENDO ROOM 4 Gender: Male Note Status: Finalized Instrument Name: Colon Scope [id number] Procedure:             Colonoscopy Indications:           High risk colon cancer surveillance: Personal history                         of colonic polyps Providers:             Rogelia Copping MD, MD Medicines:             Propofol  per Anesthesia Complications:         No immediate complications. Procedure:             Pre-Anesthesia Assessment:                        - Prior to the procedure, a History and Physical was                         performed, and patient medications and allergies were                         reviewed. The patient's tolerance of previous                         anesthesia was also reviewed. The risks and benefits                         of the procedure and the sedation options and risks                         were discussed with the patient. All questions were                         answered, and informed consent was obtained. Prior                         Anticoagulants: The patient has taken no anticoagulant                         or antiplatelet agents. ASA Grade Assessment: II - A                         patient with mild systemic disease. After reviewing                         the risks and benefits, the patient was deemed in                         satisfactory condition to undergo the procedure.                        After obtaining informed consent, the colonoscope was                         passed under direct vision.  Throughout the procedure,                         the patient's blood pressure, pulse, and oxygen                         saturations were monitored continuously. The                         Colonoscope was introduced through  the anus and                         advanced to the the cecum, identified by appendiceal                         orifice and ileocecal valve. The colonoscopy was                         performed without difficulty. The patient tolerated                         the procedure well. The quality of the bowel                         preparation was adequate to identify polyps. Findings:      The perianal and digital rectal examinations were normal.      Two sessile polyps were found in the rectum. The polyps were 2 to 3 mm       in size. These polyps were removed with a cold snare. Resection and       retrieval were complete.      A 3 mm polyp was found in the transverse colon. The polyp was sessile.       The polyp was removed with a cold snare. Resection and retrieval were       complete.      Two sessile polyps were found in the descending colon. The polyps were 3       to 4 mm in size. These polyps were removed with a cold snare. Resection       and retrieval were complete.      Multiple small-mouthed diverticula were found in the sigmoid colon.      Non-bleeding internal hemorrhoids were found during retroflexion. The       hemorrhoids were Grade II (internal hemorrhoids that prolapse but reduce       spontaneously). Impression:            - Two 2 to 3 mm polyps in the rectum, removed with a                         cold snare. Resected and retrieved.                        - One 3 mm polyp in the transverse colon, removed with                         a cold snare. Resected and retrieved.                        - Two 3 to 4 mm  polyps in the descending colon,                         removed with a cold snare. Resected and retrieved.                        - Diverticulosis in the sigmoid colon.                        - Non-bleeding internal hemorrhoids. Recommendation:        - Discharge patient to home.                        - Resume previous diet.                        - Continue  present medications.                        - Await pathology results.                        - Repeat colonoscopy in 5 years for surveillance. Procedure Code(s):     --- Professional ---                        [phone], Colonoscopy, flexible; with removal of                         tumor(s), polyp(s), or other lesion(s) by snare                         technique Diagnosis Code(s):     --- Professional ---                        Z86.010, Personal history of colonic polyps                        D12.4, Benign neoplasm of descending colon CPT copyright 2022 [employer]. All rights reserved. The codes documented in this report are preliminary and upon coder review may  be revised to meet current compliance requirements. Rogelia Copping MD, MD 03/01/2024 11:08:24 AM This report has been signed electronically. Number of Addenda: 0 Note Initiated On: 03/01/2024 10:42 AM Scope Withdrawal Time: 0 hours 8 minutes 8 seconds  Total Procedure Duration: 0 hours 12 minutes 33 seconds  Estimated Blood Loss:  Estimated blood loss: none.      [facility]

## 2024-03-01 NOTE — H&P (Signed)
 Rogelia Copping, MD [facility] [address], [address] [place], [place] [address] [phone] Fax : [phone]  Primary Care Physician:  Lemon Raisin, MD Primary Gastroenterologist:  Dr. Copping  Pre-Procedure History & Physical: HPI:  Darren Page is a 68 y.o. male is here for an colonoscopy.   Past Medical History:  Diagnosis Date   Aneurysm of aorta    Basal cell carcinoma 2018   dorsum nose, Excised by Dr. 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   History of kidney stones    History of sebaceous cyst 07/19/2017   Left shoulder   Hyperlipidemia    Hypertension     Past Surgical History:  Procedure Laterality Date   COLONOSCOPY WITH PROPOFOL  N/A 08/23/2019   Procedure: COLONOSCOPY WITH PROPOFOL ;  Surgeon: Copping Rogelia, MD;  Location: [facility] ENDOSCOPY;  Service: Endoscopy;  Laterality: N/A;  priority 3   CYSTOSCOPY/URETEROSCOPY/HOLMIUM LASER/STENT PLACEMENT Right 09/14/2020   Procedure: CYSTOSCOPY/URETEROSCOPY/HOLMIUM LASER/STENT PLACEMENT;  Surgeon: Francisca Redell BROCKS, MD;  Location: [facility] ORS;  Service: Urology;  Laterality: Right;   SKIN CANCER EXCISION     mohs nose    Prior to Admission medications   Medication Sig Start Date End Date Taking? Authorizing Provider  acetaminophen  (TYLENOL ) 500 MG tablet Take 1,000 mg by mouth every 8 (eight) hours as needed for moderate pain.   Yes [provider]  aspirin 81 MG chewable tablet Chew 81 mg by mouth daily.   Yes [provider]  cholecalciferol (VITAMIN D3) 25 MCG (1000 UNIT) tablet Take 1,000 Units by mouth daily.   Yes [provider]  Coenzyme Q10 (CO Q 10 PO) Take by mouth daily. 300mg  daily   Yes [provider]  Glucosamine-Chondroitin (OSTEO BI-FLEX REGULAR STRENGTH PO) Take by mouth.   Yes [provider]  metoprolol succinate (TOPROL-XL) 25 MG 24 hr tablet Take 25 mg by  mouth daily. 02/08/15  Yes [provider]  rosuvastatin (CRESTOR) 5 MG tablet Take 5 mg by mouth every other day. 07/19/18  Yes [provider]  nicotine  (NICODERM CQ  - DOSED IN MG/24 HR) 7 mg/24hr patch Place 1 patch (7 mg total) onto the skin daily. 11/16/23   Lemon Raisin, MD  nitroGLYCERIN (NITROSTAT) 0.4 MG SL tablet Place 0.4 mg under the tongue every 5 (five) minutes as needed for chest pain. 02/08/15   [provider]    Allergies as of 12/14/2023 - Review Complete 11/16/2023  Allergen Reaction Noted   Ezetimibe  04/06/2018   Atorvastatin Rash 11/22/2012    Family History  Problem Relation Age of Onset   Cancer Mother    Kidney failure Mother    Prostate cancer Father     Social History   Socioeconomic History   Marital status: Married    Spouse name: Heron   Number of children: 3   Years of education: Not on file   Highest education level: Not on file  Occupational History   Occupation: retired  Tobacco Use   Smoking status: Some Days    Current packs/day: 0.10    Average packs/day: 1 pack/day for 51.2 years (50.1 ttl pk-yrs)    Types: Cigarettes    Start date: 02/04/1968    Last attempt to quit: 02/03/2018    Passive exposure: Past   Smokeless tobacco: Never   Tobacco comments:    11/04/23 1-2 cigarettes some days, using Nicotine  patches  Vaping Use   Vaping status: Never Used  Substance and Sexual Activity   Alcohol use: Yes    Alcohol/week: 1.0 - 2.0 standard drink of alcohol    Types: 1 - 2 Cans of beer per week    Comment: 1-2 beers per week   Drug use: No   Sexual activity: Not Currently  Other Topics Concern   Not on file  Social History Narrative   Not on file   Social Drivers of Health   Financial Resource Strain: Low Risk  (11/04/2023)   Overall Financial Resource Strain (CARDIA)    Difficulty of Paying Living Expenses: Not hard at all  Food Insecurity: No Food Insecurity (11/04/2023)   Hunger Vital Sign    Worried About  Running Out of Food in the Last Year: Never true    Ran Out of Food in the Last Year: Never true  Transportation Needs: No Transportation Needs (11/04/2023)   PRAPARE - Administrator, Civil Service (Medical): No    Lack of Transportation (Non-Medical): No  Physical Activity: Sufficiently Active (11/04/2023)   Exercise Vital Sign    Days of Exercise per Week: 4 days    Minutes of Exercise per Session: 60 min  Stress: No Stress Concern Present (11/04/2023)   [employer] of Occupational Health - Occupational Stress Questionnaire    Feeling of Stress : Only a little  Social Connections: Moderately Integrated (11/04/2023)   Social Connection and Isolation Panel    Frequency of Communication with Friends and Family: More than three times a week    Frequency of Social Gatherings with Friends and Family: More than three times a week    Attends Religious Services: More than 4 times per year    Active Member of [employer] or Organizations: No    Attends Banker Meetings: Never    Marital Status: Married  Catering manager Violence: Not At Risk (11/04/2023)   Humiliation, Afraid, Rape, and Kick questionnaire    Fear of Current or Ex-Partner: No    Emotionally Abused: No    Physically Abused: No    Sexually Abused: No    Review of Systems: See HPI, otherwise negative ROS  Physical Exam: BP 126/74   Pulse (!) 53   Temp (!) 96.5 F (35.8 C) (Temporal)   Resp 15   Ht 5' 10.98 (1.803 m)   Wt 88.5 kg   SpO2 98%   BMI 27.21 kg/m  General:   Alert,  pleasant and cooperative in NAD Head:  Normocephalic and atraumatic. Neck:  Supple; no masses or thyromegaly. Lungs:  Clear throughout to auscultation.    Heart:  Regular rate and rhythm. Abdomen:  Soft, nontender and nondistended. Normal bowel sounds, without guarding, and without rebound.   Neurologic:  Alert and  oriented x4;  grossly normal neurologically.  Impression/Plan: Darren Page is here for an  colonoscopy to be performed for a history of adenomatous polyps on 2021   Risks, benefits, limitations, and alternatives regarding  colonoscopy have been reviewed with the patient.  Questions have been answered.  All parties agreeable.   Rogelia Copping, MD  03/01/2024, 10:41 AM

## 2024-03-01 NOTE — Transfer of Care (Signed)
 Immediate Anesthesia Transfer of Care Note  Patient: Darren Page  Procedure(s) Performed: COLONOSCOPY POLYPECTOMY, INTESTINE  Patient Location: Endoscopy Unit  Anesthesia Type:General  Level of Consciousness: sedated  Airway & Oxygen Therapy: Patient Spontanous Breathing  Post-op Assessment: Report given to RN  Post vital signs: Reviewed and stable  Last Vitals:  Vitals Value Taken Time  BP 103/66 03/01/24 11:09  Temp 36.3 C 03/01/24 11:09  Pulse 58 03/01/24 11:09  Resp 13 03/01/24 11:09  SpO2 97 % 03/01/24 11:09  Vitals shown include unfiled device data.  Last Pain:  Vitals:   03/01/24 1109  TempSrc: Temporal  PainSc: 0-No pain         Complications: No notable events documented.

## 2024-03-02 LAB — SURGICAL PATHOLOGY

## 2024-03-05 ENCOUNTER — Ambulatory Visit: Payer: Self-pay | Admitting: Gastroenterology

## 2024-03-25 ENCOUNTER — Ambulatory Visit (INDEPENDENT_AMBULATORY_CARE_PROVIDER_SITE_OTHER): Admitting: Student

## 2024-03-25 ENCOUNTER — Ambulatory Visit: Payer: Self-pay

## 2024-03-25 ENCOUNTER — Encounter: Payer: Self-pay | Admitting: Student

## 2024-03-25 VITALS — BP 118/74 | HR 55 | Temp 97.5°F | Ht 71.0 in | Wt 198.0 lb

## 2024-03-25 DIAGNOSIS — J0100 Acute maxillary sinusitis, unspecified: Secondary | ICD-10-CM | POA: Diagnosis not present

## 2024-03-25 NOTE — Telephone Encounter (Signed)
 FYI

## 2024-03-25 NOTE — Progress Notes (Signed)
 Established Patient Office Visit  Subjective   Patient ID: Darren Page, male    DOB: February 17, 1956  Age: 68 y.o. MRN: [id number]  Chief Complaint  Patient presents with   Cough    Phlegm, x 3 days ago, no fever, nasal congestion, declined COVD/FLU test   Sinusitis    Darren Page with medical hx listed below presents today for Cough with yellow phlegm that started 2 days ago. Cough is worse at night. Has associated sinus pain. Has not been getting much sleep taking care of wife for the past 2 weeks. Has taken decongestant. Denies fevers, chills, shortness of breath, chest pain, n/v/d, body aches, dizziness, or LOC.   Patient Active Problem List   Diagnosis Date Noted   History of colonic polyps 03/01/2024   Polyp of sigmoid colon 03/01/2024   Healthcare maintenance 11/16/2023   Polyp of transverse colon    Abnormal stress test 02/03/2018   Coronary artery disease 11/22/2012   Tobacco use disorder 11/22/2012      ROS Refer to HPI    Objective:     Outpatient Encounter Medications as of 03/25/2024  Medication Sig   acetaminophen  (TYLENOL ) 500 MG tablet Take 1,000 mg by mouth every 8 (eight) hours as needed for moderate pain.   aspirin 81 MG chewable tablet Chew 81 mg by mouth daily.   cholecalciferol (VITAMIN D3) 25 MCG (1000 UNIT) tablet Take 1,000 Units by mouth daily.   Coenzyme Q10 (CO Q 10 PO) Take by mouth daily. 300mg  daily   Glucosamine-Chondroitin (OSTEO BI-FLEX REGULAR STRENGTH PO) Take by mouth.   metoprolol succinate (TOPROL-XL) 25 MG 24 hr tablet Take 25 mg by mouth daily.   nicotine  (NICODERM CQ  - DOSED IN MG/24 HR) 7 mg/24hr patch Place 1 patch (7 mg total) onto the skin daily.   nitroGLYCERIN (NITROSTAT) 0.4 MG SL tablet Place 0.4 mg under the tongue every 5 (five) minutes as needed for chest pain.   rosuvastatin (CRESTOR) 5 MG tablet Take 5 mg by mouth every other day.   No facility-administered encounter medications on file as of  03/25/2024.    BP 118/74   Pulse (!) 55   Temp (!) 97.5 F (36.4 C) (Oral)   Ht 5' 11 (1.803 m)   Wt 198 lb (89.8 kg)   SpO2 97%   BMI 27.62 kg/m  BP Readings from Last 3 Encounters:  03/25/24 118/74  03/01/24 114/74  11/16/23 126/82    Physical Exam Constitutional:      Appearance: Normal appearance.  HENT:     Nose:     Right Turbinates: Swollen.     Left Turbinates: Swollen.     Right Sinus: Maxillary sinus tenderness present.     Left Sinus: Maxillary sinus tenderness present.     Mouth/Throat:     Mouth: Mucous membranes are moist.     Pharynx: Oropharynx is clear. No oropharyngeal exudate or posterior oropharyngeal erythema.  Cardiovascular:     Rate and Rhythm: Normal rate and regular rhythm.  Pulmonary:     Effort: Pulmonary effort is normal. No respiratory distress.     Breath sounds: Normal breath sounds. No stridor. No rhonchi or rales.  Abdominal:     General: Abdomen is flat. Bowel sounds are normal. There is no distension.     Palpations: Abdomen is soft.     Tenderness: There is no abdominal tenderness.  Musculoskeletal:        General: Normal range of motion.  Right lower leg: No edema.     Left lower leg: No edema.  Skin:    General: Skin is warm and dry.     Capillary Refill: Capillary refill takes less than 2 seconds.  Neurological:     General: No focal deficit present.     Mental Status: He is alert and oriented to person, place, and time.  Psychiatric:        Mood and Affect: Mood normal.        Behavior: Behavior normal.        03/25/2024    1:56 PM 11/16/2023    1:57 PM 11/04/2023   10:27 AM  Depression screen PHQ 2/9  Decreased Interest 0 0 0  Down, Depressed, Hopeless 0 0 0  PHQ - 2 Score 0 0 0  Altered sleeping  0 0  Tired, decreased energy  0 0  Change in appetite  0 0  Feeling bad or failure about yourself   0 0  Trouble concentrating  0 0  Moving slowly or fidgety/restless  0 0  Suicidal thoughts  0 0  PHQ-9 Score   0 0  Difficult doing work/chores  Not difficult at all Not difficult at all       03/25/2024    1:56 PM 11/16/2023    1:58 PM 01/27/2023    4:12 PM 06/12/2022    9:29 AM  GAD 7 : Generalized Anxiety Score  Nervous, Anxious, on Edge 0 0 0 0  Control/stop worrying 0 0 0 0  Worry too much - different things  0 0 0  Trouble relaxing  0 0 0  Restless  0 0 0  Easily annoyed or irritable  0 0 0  Afraid - awful might happen  0 0 0  Total GAD 7 Score  0 0 0  Anxiety Difficulty  Not difficult at all Not difficult at all Not difficult at all    No results found for any visits on 03/25/24.  Last CBC Lab Results  Component Value Date   WBC 9.1 09/05/2020   HGB 17.9 (H) 09/05/2020   HCT 52.8 (H) 09/05/2020   MCV 94.8 09/05/2020   MCH 32.1 09/05/2020   RDW 12.9 09/05/2020   PLT 239 09/05/2020   Last metabolic panel Lab Results  Component Value Date   GLUCOSE 125 (H) 09/05/2020   NA 137 09/05/2020   K 4.5 09/05/2020   CL 104 09/05/2020   CO2 26 09/05/2020   BUN 29 (H) 09/05/2020   CREATININE 1.32 (H) 09/05/2020   GFRNONAA 59 (L) 09/05/2020   CALCIUM 9.2 09/05/2020   PROT 8.2 (H) 09/05/2020   ALBUMIN 4.7 09/05/2020   BILITOT 0.5 09/05/2020   ALKPHOS 68 09/05/2020   AST 29 09/05/2020   ALT 23 09/05/2020   ANIONGAP 7 09/05/2020   The 10-year ASCVD risk score (Arnett DK, et al., 2019) is: 17.7%    Assessment & Plan:  Acute non-recurrent maxillary sinusitis Likely viral discussed symptomatic treatment with intranasal corticosteroids and nasal irrigation. Guaifenesin  for cough. Follow up if symptoms not improving or worsening.  No follow-ups on file.    Darren Saddler, MD

## 2024-03-25 NOTE — Telephone Encounter (Signed)
 FYI Only or Action Required?: FYI only for provider.  Patient was last seen in primary care on 11/16/2023 by Lemon Raisin, MD.  Called Nurse Triage reporting Cough.  Symptoms began several days ago.  Interventions attempted: Rest, hydration, or home remedies.  Symptoms are: gradually worsening.  Triage Disposition: See Physician Within 24 Hours  Patient/caregiver understands and will follow disposition?: Yes    Copied from CRM [id number]. Topic: Clinical - Red Word Triage >> Mar 25, 2024  8:04 AM Treva T wrote: [facility] that prompted transfer to Nurse Triage: Patient spouse, Heron calling on behalf of patient, states patient is having increased coughing, headache/pain behind his eyes, with colored, yellowish mucous.   Requesting an appointment for evaluation. Reason for Disposition  SEVERE coughing spells (e.g., whooping sound after coughing, vomiting after coughing)  Answer Assessment - Initial Assessment Questions 1. ONSET: When did the cough begin?      Wednesday  2. SEVERITY: How bad is the cough today?      Worsened cough 3. SPUTUM: Describe the color of your sputum (e.g., none, dry cough; clear, white, yellow, green)     Yellow  4. HEMOPTYSIS: Are you coughing up any blood? If Yes, ask: How much? (e.g., flecks, streaks, tablespoons, etc.)     No 5. DIFFICULTY BREATHING: Are you having difficulty breathing? If Yes, ask: How bad is it? (e.g., mild, moderate, severe)      No 6. FEVER: Do you have a fever? If Yes, ask: What is your temperature, how was it measured, and when did it start?     No 7. CARDIAC HISTORY: Do you have any history of heart disease? (e.g., heart attack, congestive heart failure)      CAD 8. LUNG HISTORY: Do you have any history of lung disease?  (e.g., pulmonary embolus, asthma, emphysema)     No lung disease noted 10. OTHER SYMPTOMS: Do you have any other symptoms? (e.g., runny nose, wheezing, chest pain)       Sinus pain  behind eyes, headache  Protocols used: Cough - Acute Productive-A-AH

## 2024-06-08 ENCOUNTER — Ambulatory Visit: Payer: Medicare Other | Admitting: Dermatology

## 2024-08-15 ENCOUNTER — Ambulatory Visit: Admitting: Dermatology

## 2024-11-09 ENCOUNTER — Ambulatory Visit
